# Patient Record
Sex: Male | Born: 1945 | Race: White | Hispanic: No | Marital: Married | Smoking: Former smoker
Health system: Southern US, Community
[De-identification: ages and names within clinical notes are randomized; demographics above are authoritative.]

## PROBLEM LIST (undated history)

## (undated) DIAGNOSIS — L719 Rosacea, unspecified: Secondary | ICD-10-CM

## (undated) DIAGNOSIS — M549 Dorsalgia, unspecified: Secondary | ICD-10-CM

## (undated) DIAGNOSIS — E119 Type 2 diabetes mellitus without complications: Secondary | ICD-10-CM

## (undated) DIAGNOSIS — I2510 Atherosclerotic heart disease of native coronary artery without angina pectoris: Secondary | ICD-10-CM

## (undated) DIAGNOSIS — I10 Essential (primary) hypertension: Secondary | ICD-10-CM

## (undated) DIAGNOSIS — D369 Benign neoplasm, unspecified site: Secondary | ICD-10-CM

## (undated) DIAGNOSIS — E785 Hyperlipidemia, unspecified: Secondary | ICD-10-CM

## (undated) HISTORY — DX: Hyperlipidemia, unspecified: E78.5

## (undated) HISTORY — DX: Rosacea, unspecified: L71.9

## (undated) HISTORY — PX: CORONARY ARTERY BYPASS GRAFT: SHX141

## (undated) HISTORY — DX: Benign neoplasm, unspecified site: D36.9

## (undated) HISTORY — DX: Atherosclerotic heart disease of native coronary artery without angina pectoris: I25.10

## (undated) HISTORY — DX: Dorsalgia, unspecified: M54.9

## (undated) HISTORY — PX: APPENDECTOMY: SHX54

## (undated) HISTORY — PX: AORTIC VALVE REPLACEMENT: SHX41

## (undated) HISTORY — PX: NASAL SEPTUM SURGERY: SHX37

## (undated) HISTORY — DX: Type 2 diabetes mellitus without complications: E11.9

## (undated) HISTORY — PX: CHOLECYSTECTOMY: SHX55

## (undated) HISTORY — DX: Essential (primary) hypertension: I10

## (undated) HISTORY — PX: CATARACT EXTRACTION: SUR2

---

## 1999-02-14 ENCOUNTER — Encounter: Payer: Self-pay | Admitting: Internal Medicine

## 1999-02-14 ENCOUNTER — Inpatient Hospital Stay (HOSPITAL_COMMUNITY): Admission: EM | Admit: 1999-02-14 | Discharge: 1999-02-24 | Payer: Self-pay | Admitting: Emergency Medicine

## 1999-02-15 ENCOUNTER — Encounter: Payer: Self-pay | Admitting: Thoracic Surgery (Cardiothoracic Vascular Surgery)

## 1999-02-17 ENCOUNTER — Encounter: Payer: Self-pay | Admitting: Thoracic Surgery (Cardiothoracic Vascular Surgery)

## 1999-02-18 ENCOUNTER — Encounter: Payer: Self-pay | Admitting: Thoracic Surgery (Cardiothoracic Vascular Surgery)

## 1999-02-19 ENCOUNTER — Encounter: Payer: Self-pay | Admitting: Thoracic Surgery (Cardiothoracic Vascular Surgery)

## 1999-02-21 ENCOUNTER — Encounter: Payer: Self-pay | Admitting: Thoracic Surgery (Cardiothoracic Vascular Surgery)

## 2000-01-08 ENCOUNTER — Encounter
Admission: RE | Admit: 2000-01-08 | Discharge: 2000-01-08 | Payer: Self-pay | Admitting: Thoracic Surgery (Cardiothoracic Vascular Surgery)

## 2000-01-08 ENCOUNTER — Encounter: Payer: Self-pay | Admitting: Thoracic Surgery (Cardiothoracic Vascular Surgery)

## 2001-12-31 ENCOUNTER — Encounter: Payer: Self-pay | Admitting: General Surgery

## 2002-01-02 ENCOUNTER — Ambulatory Visit (HOSPITAL_COMMUNITY): Admission: RE | Admit: 2002-01-02 | Discharge: 2002-01-03 | Payer: Self-pay | Admitting: General Surgery

## 2002-01-02 ENCOUNTER — Encounter (INDEPENDENT_AMBULATORY_CARE_PROVIDER_SITE_OTHER): Payer: Self-pay | Admitting: Specialist

## 2003-10-22 ENCOUNTER — Encounter: Payer: Self-pay | Admitting: Gastroenterology

## 2004-09-18 ENCOUNTER — Ambulatory Visit: Payer: Self-pay | Admitting: Cardiology

## 2005-03-21 ENCOUNTER — Ambulatory Visit: Payer: Self-pay | Admitting: Internal Medicine

## 2005-11-06 ENCOUNTER — Ambulatory Visit: Payer: Self-pay | Admitting: Cardiology

## 2006-02-13 ENCOUNTER — Ambulatory Visit: Payer: Self-pay | Admitting: Internal Medicine

## 2006-03-22 ENCOUNTER — Ambulatory Visit: Payer: Self-pay | Admitting: Internal Medicine

## 2006-05-24 ENCOUNTER — Ambulatory Visit: Payer: Self-pay | Admitting: Internal Medicine

## 2006-06-27 ENCOUNTER — Ambulatory Visit: Payer: Self-pay | Admitting: Cardiology

## 2006-08-21 ENCOUNTER — Ambulatory Visit: Payer: Self-pay | Admitting: Cardiology

## 2006-10-03 ENCOUNTER — Ambulatory Visit: Payer: Self-pay | Admitting: Cardiology

## 2006-10-24 ENCOUNTER — Ambulatory Visit: Payer: Self-pay | Admitting: Cardiology

## 2006-11-13 ENCOUNTER — Ambulatory Visit: Payer: Self-pay | Admitting: Cardiology

## 2006-11-13 LAB — CONVERTED CEMR LAB
ALT: 23 units/L (ref 0–40)
AST: 23 units/L (ref 0–37)
Albumin: 4.2 g/dL (ref 3.5–5.2)
Alkaline Phosphatase: 58 units/L (ref 39–117)
BUN: 8 mg/dL (ref 6–23)
Bilirubin, Direct: 0.2 mg/dL (ref 0.0–0.3)
CO2: 33 meq/L — ABNORMAL HIGH (ref 19–32)
Calcium: 9.5 mg/dL (ref 8.4–10.5)
Chloride: 101 meq/L (ref 96–112)
Cholesterol: 169 mg/dL (ref 0–200)
Creatinine, Ser: 1 mg/dL (ref 0.4–1.5)
Direct LDL: 55.6 mg/dL
GFR calc Af Amer: 97 mL/min
GFR calc non Af Amer: 80 mL/min
Glucose, Bld: 225 mg/dL — ABNORMAL HIGH (ref 70–99)
HDL: 37.1 mg/dL — ABNORMAL LOW (ref >39.0)
Potassium: 3.5 meq/L (ref 3.5–5.1)
Sodium: 139 meq/L (ref 135–145)
Total Bilirubin: 1.2 mg/dL (ref 0.3–1.2)
Total CHOL/HDL Ratio: 4.6
Total Protein: 6.9 g/dL (ref 6.0–8.3)
Triglycerides: 511 mg/dL (ref 0–149)
VLDL: 102 mg/dL — ABNORMAL HIGH (ref 0–40)

## 2006-12-25 ENCOUNTER — Ambulatory Visit: Payer: Self-pay | Admitting: Cardiology

## 2007-01-23 ENCOUNTER — Ambulatory Visit: Payer: Self-pay | Admitting: Cardiology

## 2007-02-12 ENCOUNTER — Ambulatory Visit: Payer: Self-pay

## 2007-02-12 ENCOUNTER — Ambulatory Visit: Payer: Self-pay | Admitting: Internal Medicine

## 2007-02-12 ENCOUNTER — Encounter: Payer: Self-pay | Admitting: Cardiology

## 2007-04-10 ENCOUNTER — Ambulatory Visit: Payer: Self-pay | Admitting: Internal Medicine

## 2007-05-22 ENCOUNTER — Ambulatory Visit: Payer: Self-pay | Admitting: Internal Medicine

## 2007-06-05 ENCOUNTER — Ambulatory Visit: Payer: Self-pay | Admitting: Internal Medicine

## 2007-06-05 LAB — CONVERTED CEMR LAB
ALT: 19 units/L (ref 0–53)
AST: 21 units/L (ref 0–37)
BUN: 11 mg/dL (ref 6–23)
CO2: 32 meq/L (ref 19–32)
Calcium: 9.3 mg/dL (ref 8.4–10.5)
Chloride: 99 meq/L (ref 96–112)
Cholesterol: 161 mg/dL (ref 0–200)
Creatinine, Ser: 1 mg/dL (ref 0.4–1.5)
Direct LDL: 54.1 mg/dL
GFR calc Af Amer: 97 mL/min
GFR calc non Af Amer: 80 mL/min
Glucose, Bld: 247 mg/dL — ABNORMAL HIGH (ref 70–99)
HDL: 31.8 mg/dL — ABNORMAL LOW (ref >39.0)
Hgb A1c MFr Bld: 7.9 % — ABNORMAL HIGH (ref 4.6–6.0)
INR: 2.4 — ABNORMAL HIGH (ref 0.8–1.0)
PSA: 0.61 ng/mL (ref 0.10–4.00)
Potassium: 3.8 meq/L (ref 3.5–5.1)
Prothrombin Time: 19.4 s — ABNORMAL HIGH (ref 10.9–13.3)
Sodium: 140 meq/L (ref 135–145)
Total CHOL/HDL Ratio: 5.1
Triglycerides: 546 mg/dL (ref 0–149)
VLDL: 109 mg/dL — ABNORMAL HIGH (ref 0–40)

## 2007-07-08 ENCOUNTER — Ambulatory Visit: Payer: Self-pay | Admitting: Internal Medicine

## 2007-07-08 ENCOUNTER — Encounter: Payer: Self-pay | Admitting: Internal Medicine

## 2007-07-08 DIAGNOSIS — I10 Essential (primary) hypertension: Secondary | ICD-10-CM

## 2007-07-08 DIAGNOSIS — H4089 Other specified glaucoma: Secondary | ICD-10-CM | POA: Insufficient documentation

## 2007-07-08 DIAGNOSIS — E1351 Other specified diabetes mellitus with diabetic peripheral angiopathy without gangrene: Secondary | ICD-10-CM

## 2007-07-08 DIAGNOSIS — L719 Rosacea, unspecified: Secondary | ICD-10-CM

## 2007-07-08 DIAGNOSIS — E1365 Other specified diabetes mellitus with hyperglycemia: Secondary | ICD-10-CM

## 2007-07-08 DIAGNOSIS — E785 Hyperlipidemia, unspecified: Secondary | ICD-10-CM

## 2007-09-25 ENCOUNTER — Ambulatory Visit: Payer: Self-pay | Admitting: Internal Medicine

## 2007-09-25 LAB — CONVERTED CEMR LAB: Hgb A1c MFr Bld: 7.1 % — ABNORMAL HIGH (ref 4.6–6.0)

## 2007-09-26 ENCOUNTER — Encounter: Payer: Self-pay | Admitting: Internal Medicine

## 2007-10-08 ENCOUNTER — Ambulatory Visit: Payer: Self-pay | Admitting: Cardiology

## 2007-10-08 ENCOUNTER — Encounter: Payer: Self-pay | Admitting: Internal Medicine

## 2007-12-11 ENCOUNTER — Ambulatory Visit: Payer: Self-pay | Admitting: Internal Medicine

## 2007-12-11 ENCOUNTER — Ambulatory Visit: Payer: Self-pay | Admitting: Cardiology

## 2007-12-13 ENCOUNTER — Encounter: Payer: Self-pay | Admitting: Internal Medicine

## 2008-03-02 ENCOUNTER — Telehealth: Payer: Self-pay | Admitting: Internal Medicine

## 2008-03-02 ENCOUNTER — Ambulatory Visit: Payer: Self-pay | Admitting: Internal Medicine

## 2008-03-02 LAB — CONVERTED CEMR LAB
CO2: 31 meq/L (ref 19–32)
Calcium: 9.5 mg/dL (ref 8.4–10.5)
Creatinine, Ser: 1 mg/dL (ref 0.4–1.5)
Glucose, Bld: 246 mg/dL — ABNORMAL HIGH (ref 70–99)
HDL: 27.1 mg/dL — ABNORMAL LOW (ref >39.0)
Hgb A1c MFr Bld: 8.6 % — ABNORMAL HIGH (ref 4.6–6.0)
INR: 5.8 (ref 0.8–1.0)
Total CHOL/HDL Ratio: 5.3
VLDL: 81 mg/dL — ABNORMAL HIGH (ref 0–40)

## 2008-03-04 ENCOUNTER — Telehealth: Payer: Self-pay | Admitting: Internal Medicine

## 2008-03-08 ENCOUNTER — Encounter: Payer: Self-pay | Admitting: Internal Medicine

## 2008-03-10 ENCOUNTER — Ambulatory Visit: Payer: Self-pay | Admitting: Cardiology

## 2008-03-30 ENCOUNTER — Ambulatory Visit: Payer: Self-pay | Admitting: Internal Medicine

## 2008-03-30 LAB — CONVERTED CEMR LAB: INR: 3.2 — ABNORMAL HIGH (ref 0.8–1.0)

## 2008-04-06 ENCOUNTER — Telehealth: Payer: Self-pay | Admitting: Internal Medicine

## 2008-06-14 ENCOUNTER — Encounter: Payer: Self-pay | Admitting: Internal Medicine

## 2008-07-28 ENCOUNTER — Telehealth: Payer: Self-pay | Admitting: Internal Medicine

## 2008-08-06 ENCOUNTER — Ambulatory Visit: Payer: Self-pay | Admitting: Internal Medicine

## 2008-08-06 LAB — CONVERTED CEMR LAB
Alkaline Phosphatase: 41 units/L (ref 39–117)
Bilirubin, Direct: 0.2 mg/dL (ref 0.0–0.3)
Calcium: 9.6 mg/dL (ref 8.4–10.5)
Cholesterol: 122 mg/dL (ref 0–200)
GFR calc Af Amer: 71 mL/min
GFR calc non Af Amer: 59 mL/min
Glucose, Bld: 135 mg/dL — ABNORMAL HIGH (ref 70–99)
HDL: 29 mg/dL — ABNORMAL LOW (ref >39.0)
INR: 4.7 — ABNORMAL HIGH (ref 0.8–1.0)
PSA: 0.78 ng/mL (ref 0.10–4.00)
Potassium: 4.1 meq/L (ref 3.5–5.1)
Prothrombin Time: 47.1 s — ABNORMAL HIGH (ref 10.9–13.3)
Sodium: 144 meq/L (ref 135–145)
TSH: 1.62 microintl units/mL (ref 0.35–5.50)
Total Bilirubin: 1 mg/dL (ref 0.3–1.2)
Total Protein: 6.9 g/dL (ref 6.0–8.3)
VLDL: 35 mg/dL (ref 0–40)

## 2008-08-17 ENCOUNTER — Ambulatory Visit: Payer: Self-pay | Admitting: Internal Medicine

## 2008-09-15 ENCOUNTER — Telehealth: Payer: Self-pay | Admitting: Internal Medicine

## 2008-09-23 ENCOUNTER — Telehealth: Payer: Self-pay | Admitting: Internal Medicine

## 2008-10-07 ENCOUNTER — Telehealth: Payer: Self-pay | Admitting: Internal Medicine

## 2008-11-22 ENCOUNTER — Telehealth: Payer: Self-pay | Admitting: Internal Medicine

## 2008-12-23 ENCOUNTER — Telehealth: Payer: Self-pay | Admitting: Internal Medicine

## 2009-02-17 ENCOUNTER — Telehealth: Payer: Self-pay | Admitting: Internal Medicine

## 2009-02-28 ENCOUNTER — Telehealth: Payer: Self-pay | Admitting: Internal Medicine

## 2009-03-02 ENCOUNTER — Ambulatory Visit: Payer: Self-pay | Admitting: Internal Medicine

## 2009-03-02 DIAGNOSIS — M549 Dorsalgia, unspecified: Secondary | ICD-10-CM | POA: Insufficient documentation

## 2009-03-15 ENCOUNTER — Ambulatory Visit: Payer: Self-pay | Admitting: Cardiology

## 2009-04-04 ENCOUNTER — Telehealth: Payer: Self-pay | Admitting: Internal Medicine

## 2009-04-15 ENCOUNTER — Telehealth: Payer: Self-pay | Admitting: Internal Medicine

## 2009-05-19 ENCOUNTER — Ambulatory Visit: Payer: Self-pay | Admitting: Internal Medicine

## 2009-05-19 ENCOUNTER — Telehealth: Payer: Self-pay | Admitting: Internal Medicine

## 2009-05-19 LAB — CONVERTED CEMR LAB
ALT: 22 units/L (ref 0–53)
AST: 21 units/L (ref 0–37)
Albumin: 4.4 g/dL (ref 3.5–5.2)
Bilirubin Urine: NEGATIVE
CO2: 30 meq/L (ref 19–32)
Chloride: 103 meq/L (ref 96–112)
Cholesterol: 147 mg/dL (ref 0–200)
Creatinine, Ser: 1.2 mg/dL (ref 0.4–1.5)
Direct LDL: 76.1 mg/dL
HCT: 37.8 % — ABNORMAL LOW (ref 39.0–52.0)
HDL: 31.2 mg/dL — ABNORMAL LOW (ref >39.00)
Hemoglobin: 12.7 g/dL — ABNORMAL LOW (ref 13.0–17.0)
Ketones, ur: NEGATIVE mg/dL
Lymphocytes Relative: 22 % (ref 12–46)
Lymphs Abs: 0.9 10*3/uL (ref 0.7–4.0)
Monocytes Absolute: 0.3 10*3/uL (ref 0.1–1.0)
Monocytes Relative: 7 % (ref 3–12)
Neutro Abs: 2.6 10*3/uL (ref 1.7–7.7)
PSA: 0.55 ng/mL (ref 0.10–4.00)
Potassium: 4.2 meq/L (ref 3.5–5.1)
Prothrombin Time: 80.6 s (ref 9.1–11.7)
RBC: 4.24 M/uL (ref 4.22–5.81)
Sodium: 144 meq/L (ref 135–145)
TSH: 1.61 microintl units/mL (ref 0.35–5.50)
Total Bilirubin: 1 mg/dL (ref 0.3–1.2)
Total CHOL/HDL Ratio: 5
Total Protein, Urine: 30 mg/dL
Triglycerides: 268 mg/dL — ABNORMAL HIGH (ref 0.0–149.0)
pH: 6 (ref 5.0–8.0)

## 2009-05-26 ENCOUNTER — Encounter: Payer: Self-pay | Admitting: Cardiology

## 2009-06-01 ENCOUNTER — Ambulatory Visit: Payer: Self-pay | Admitting: Internal Medicine

## 2009-06-01 LAB — CONVERTED CEMR LAB
Hgb A1c MFr Bld: 6.7 % — ABNORMAL HIGH (ref 4.6–6.5)
INR: 3.2 — ABNORMAL HIGH (ref 0.8–1.0)
Prothrombin Time: 32.7 s — ABNORMAL HIGH (ref 9.1–11.7)

## 2009-06-02 ENCOUNTER — Encounter (INDEPENDENT_AMBULATORY_CARE_PROVIDER_SITE_OTHER): Payer: Self-pay | Admitting: *Deleted

## 2009-06-05 DIAGNOSIS — I2510 Atherosclerotic heart disease of native coronary artery without angina pectoris: Secondary | ICD-10-CM

## 2009-06-05 DIAGNOSIS — Z952 Presence of prosthetic heart valve: Secondary | ICD-10-CM | POA: Insufficient documentation

## 2009-06-15 ENCOUNTER — Encounter: Payer: Self-pay | Admitting: Internal Medicine

## 2009-06-15 ENCOUNTER — Encounter (INDEPENDENT_AMBULATORY_CARE_PROVIDER_SITE_OTHER): Payer: Self-pay | Admitting: *Deleted

## 2009-06-15 ENCOUNTER — Ambulatory Visit: Payer: Self-pay | Admitting: Gastroenterology

## 2009-06-15 DIAGNOSIS — Z86010 Personal history of colonic polyps: Secondary | ICD-10-CM

## 2009-06-16 ENCOUNTER — Telehealth (INDEPENDENT_AMBULATORY_CARE_PROVIDER_SITE_OTHER): Payer: Self-pay | Admitting: *Deleted

## 2009-06-21 LAB — HM COLONOSCOPY

## 2009-06-28 ENCOUNTER — Telehealth: Payer: Self-pay | Admitting: Gastroenterology

## 2009-06-29 ENCOUNTER — Telehealth: Payer: Self-pay | Admitting: Internal Medicine

## 2009-06-29 ENCOUNTER — Ambulatory Visit: Payer: Self-pay | Admitting: Gastroenterology

## 2009-06-29 ENCOUNTER — Encounter: Payer: Self-pay | Admitting: Gastroenterology

## 2009-07-01 ENCOUNTER — Encounter: Payer: Self-pay | Admitting: Gastroenterology

## 2009-07-04 ENCOUNTER — Encounter (INDEPENDENT_AMBULATORY_CARE_PROVIDER_SITE_OTHER): Payer: Self-pay

## 2009-08-16 ENCOUNTER — Telehealth: Payer: Self-pay | Admitting: Internal Medicine

## 2009-08-23 ENCOUNTER — Telehealth: Payer: Self-pay | Admitting: Internal Medicine

## 2009-08-30 ENCOUNTER — Telehealth: Payer: Self-pay | Admitting: Internal Medicine

## 2009-09-06 ENCOUNTER — Telehealth: Payer: Self-pay | Admitting: Internal Medicine

## 2009-09-07 ENCOUNTER — Ambulatory Visit: Payer: Self-pay | Admitting: Internal Medicine

## 2009-09-07 DIAGNOSIS — L989 Disorder of the skin and subcutaneous tissue, unspecified: Secondary | ICD-10-CM | POA: Insufficient documentation

## 2009-09-07 DIAGNOSIS — N453 Epididymo-orchitis: Secondary | ICD-10-CM | POA: Insufficient documentation

## 2009-09-07 DIAGNOSIS — J019 Acute sinusitis, unspecified: Secondary | ICD-10-CM

## 2009-09-07 LAB — CONVERTED CEMR LAB
Leukocytes, UA: NEGATIVE
Nitrite: NEGATIVE
Urine Glucose: NEGATIVE mg/dL
Urobilinogen, UA: 0.2 (ref 0.0–1.0)

## 2009-09-16 ENCOUNTER — Telehealth: Payer: Self-pay | Admitting: Internal Medicine

## 2009-09-19 ENCOUNTER — Telehealth: Payer: Self-pay | Admitting: Internal Medicine

## 2009-11-28 ENCOUNTER — Telehealth: Payer: Self-pay | Admitting: Internal Medicine

## 2009-12-08 ENCOUNTER — Ambulatory Visit: Payer: Self-pay | Admitting: Cardiology

## 2010-02-02 ENCOUNTER — Telehealth (INDEPENDENT_AMBULATORY_CARE_PROVIDER_SITE_OTHER): Payer: Self-pay | Admitting: *Deleted

## 2010-06-01 ENCOUNTER — Telehealth: Payer: Self-pay | Admitting: Internal Medicine

## 2010-06-02 ENCOUNTER — Ambulatory Visit: Payer: Self-pay | Admitting: Internal Medicine

## 2010-06-02 DIAGNOSIS — R6882 Decreased libido: Secondary | ICD-10-CM

## 2010-06-02 LAB — CONVERTED CEMR LAB
ALT: 19 units/L (ref 0–53)
AST: 22 units/L (ref 0–37)
CO2: 30 meq/L (ref 19–32)
Calcium: 9.4 mg/dL (ref 8.4–10.5)
Creatinine, Ser: 1.1 mg/dL (ref 0.4–1.5)
INR: 2.6 — ABNORMAL HIGH (ref 0.8–1.0)
TSH: 1.32 microintl units/mL (ref 0.35–5.50)
Testosterone: 254.39 ng/dL — ABNORMAL LOW (ref 350.00–890.00)
Total Bilirubin: 1 mg/dL (ref 0.3–1.2)
Triglycerides: 223 mg/dL — ABNORMAL HIGH (ref 0.0–149.0)

## 2010-08-31 ENCOUNTER — Telehealth: Payer: Self-pay | Admitting: Internal Medicine

## 2010-11-12 LAB — CONVERTED CEMR LAB
ALT: 23 units/L (ref 0–53)
Albumin: 4.2 g/dL (ref 3.5–5.2)
Alkaline Phosphatase: 72 units/L (ref 39–117)
Direct LDL: 53.3 mg/dL
Total Bilirubin: 1.9 mg/dL — ABNORMAL HIGH (ref 0.3–1.2)
Triglycerides: 538 mg/dL (ref 0–149)

## 2010-11-14 NOTE — Progress Notes (Signed)
Summary: Glipizide refill  Phone Note Refill Request Message from:  Fax from Pharmacy on November 28, 2009 4:08 PM  Refills Requested: Medication #1:  GLIPIZIDE 10 MG TABS Take 1/2 tablet by mouth two times a day Patient has not seen you since 05/2009. How many refills would you like to provide?  Next Appointment Scheduled: none Initial call taken by: Lucious Groves,  November 28, 2009 4:08 PM  Follow-up for Phone Call        ok for as needed refills  Follow-up by: Jacques Navy MD,  November 28, 2009 6:10 PM    Prescriptions: GLIPIZIDE 10 MG TABS (GLIPIZIDE) Take 1/2 tablet by mouth two times a day  #90 x 3   Entered by:   Lamar Sprinkles, CMA   Authorized by:   Jacques Navy MD   Signed by:   Lamar Sprinkles, CMA on 11/29/2009   Method used:   Electronically to        [facility] [phone]* (retail)       [address] [place], [place]  [address]       Ph: [phone]       Fax: [phone]   RxID:   [id number] GLIPIZIDE 10 MG TABS (GLIPIZIDE) Take 1/2 tablet by mouth two times a day  #90 x 0   Entered by:   Lucious Groves   Authorized by:   Jacques Navy MD   Signed by:   Jacques Navy MD on 11/28/2009   Method used:   Electronically to        [facility] [phone]* (retail)       [address] [place], [place]  [address]       Ph: [phone]       Fax: [phone]   RxID:   [id number]

## 2010-11-14 NOTE — Progress Notes (Signed)
Summary: RF  Phone Note From Pharmacy   Caller: [facility] Summary of Call: Pharm called req refill of warfarin 5mg  take as directed. Ok to fill? Who manages pt's INR? Should they fill med?  Initial call taken by: Lamar Sprinkles, CMA,  June 01, 2010 3:12 PM  Follow-up for Phone Call        I forget who manages his INR - I vaguely remember him having a home unit. OK to refill Rx Follow-up by: Jacques Navy MD,  June 01, 2010 4:22 PM    Prescriptions: WARFARIN SODIUM 7.5 MG TABS (WARFARIN SODIUM) Take 1 tablet by mouth once a day  #90 x 1   Entered by:   Lamar Sprinkles, CMA   Authorized by:   Jacques Navy MD   Signed by:   Lamar Sprinkles, CMA on 06/01/2010   Method used:   Electronically to        [facility] [phone]* (retail)       [address] [place], [place]  [address]       Ph: [phone]       Fax: [phone]   RxID:   [id number]

## 2010-11-14 NOTE — Progress Notes (Signed)
Summary: REFILL COUMADIN  Phone Note Refill Request Message from:  Pharmacy  Refills Requested: Medication #1:  WARFARIN SODIUM 7.5 MG TABS Take 1 tablet by mouth once a day Pharm req refill, who is managing INR's? Is this ok to fill?   Initial call taken by: Lamar Sprinkles, CMA,  August 31, 2010 11:45 AM  Follow-up for Phone Call        1. Ptient has a home monitor - he manages his own dosing. 2. OK for as needed refills. Follow-up by: Jacques Navy MD,  August 31, 2010 1:01 PM    Prescriptions: WARFARIN SODIUM 7.5 MG TABS (WARFARIN SODIUM) Take 1 tablet by mouth once a day  #90 x 3   Entered by:   Lamar Sprinkles, CMA   Authorized by:   Jacques Navy MD   Signed by:   Lamar Sprinkles, CMA on 08/31/2010   Method used:   Electronically to        [facility] [phone]* (retail)       [address] [place], [place]  [address]       Ph: [phone]       Fax: [phone]   RxID:   [id number]

## 2010-11-14 NOTE — Progress Notes (Signed)
Summary: Records request from [facility]  Request for records received from [facility]. Request forwarded to [facility]. Dena Chavis  February 02, 2010 3:27 PM

## 2010-11-14 NOTE — Assessment & Plan Note (Signed)
Summary: YEARLY FU/ TO COME FASTING/NWS   Vital Signs:  Patient profile:   65 year old male Height:      71 inches Weight:      184 pounds BMI:     25.76 O2 Sat:      98 % on Room air Temp:     97.3 degrees F oral Pulse rate:   55 / minute BP sitting:   128 / 70  (left arm) Cuff size:   regular  Vitals Entered By: Bill Salinas CMA (June 02, 2010 10:01 AM)  O2 Flow:  Room air CC: cpx/ ab   Primary Care Provider:  Laurita Quint, MD  CC:  cpx/ ab.  History of Present Illness: Patient presents for routine medical follow-up. He has put on a little weight which may be increased muscle secondary to more rigorous work-out program.   He reports that he is feeling OK. He does have mild knee pain lateral tibial condyle suggestive of tendonitis. He is working out every other day. He does report that he seems to get fluid in/on the right ear frequently. He reports he is having decreased tumescence but has plenty of interest.   He does do home monitoring of his INR. He is interested in having this done in the lab today.   Preventive Screening-Counseling & Management  Alcohol-Tobacco     Alcohol drinks/day: 0     Smoking Status: quit     Year Quit: 40 years ago  Caffeine-Diet-Exercise     Caffeine use/day: 4 drinks per day     Does Patient Exercise: yes     Type of exercise: weight lifting, cardio     Exercise (avg: min/session): 30-60     Times/week: 4  Hep-HIV-STD-Contraception     Dental Visit-last 6 months no     Sun Exposure-Excessive: yes  Safety-Violence-Falls     Seat Belt Use: yes     Firearms in the Home: firearms in the home     Smoke Detectors: yes     Fall Risk: Low fall risk  Current Medications (verified): 1)  Furosemide 40 Mg  Tabs (Furosemide) .... Once Daily 2)  Crestor 10 Mg  Tabs (Rosuvastatin Calcium) .... Take One Tablet Every Morning 3)  Benazepril Hcl 20 Mg Tabs (Benazepril Hcl) .Marland Kitchen.. 1 By Mouth Once Daily For Blood Pressure 4)  Warfarin Sodium  7.5 Mg Tabs (Warfarin Sodium) .... Take 1 Tablet By Mouth Once A Day 5)  Klor-Con 10 10 Meq Cr-Tabs (Potassium Chloride) .Marland Kitchen.. 1 Two Times A Day 6)  Lopressor 50 Mg  Tabs (Metoprolol Tartrate) .... Take 1 Tablet By Mouth Two Times A Day 7)  Metformin Hcl 1000 Mg  Tabs (Metformin Hcl) .Marland Kitchen.. 1 By Mouth Two Times A Day. 8)  Multivitamins   Tabs (Multiple Vitamin) .... Take 1 Tablet By Mouth Once A Day 9)  Xalatan 0.005 %  Soln (Latanoprost) .... As Directed 10)  Norvasc 10 Mg  Tabs (Amlodipine Besylate) .... Take 1 Tablet By Mouth Once A Day 11)  Zolpidem Tartrate 10 Mg  Tabs (Zolpidem Tartrate) .Marland Kitchen.. 1 By Mouth At Bedtime As Needed 12)  Fenofibrate 160 Mg Tabs (Fenofibrate) .... One By Mouth Daily 13)  Glipizide 10 Mg Tabs (Glipizide) .... Take 1/2 Tablet By Mouth Two Times A Day 14)  Vitamin C Cr 500 Mg Cr-Tabs (Ascorbic Acid) .... Take One Tablt Three Times A Day 15)  Promethazine-Codeine 6.25-10 Mg/26ml Syrp (Promethazine-Codeine)  Allergies (verified): 1)  ! 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 valve 2000  Family History: Last updated: 06-30-2009 Father - deceased @ 50's  : CAD/MI,  Mother- deceased @ 29: COPD Neg- colon cancer Maternal Uncle - prostate cancer Maternal Uncle x 2- DM Family History of Heart Disease: Father  Social History: Last updated: 06-30-2009 [employer]  Married '68 - 12 years divorced, long-term relationship 30 yrs 3 daughters - '70, '72, '75: 5  grandchildren work: independent man Patient is a former smoker. stopped smoking over 25 years ago Alcohol Use - no Daily Caffeine Use-4 cups daily Illicit Drug Use - no Patient gets regular exercise.  Risk Factors: Alcohol Use: 0 (06/02/2010) Caffeine Use: 4 drinks per day (06/02/2010) Exercise: yes (06/02/2010)  Risk Factors: Smoking Status: quit (06/02/2010)  Social History: Caffeine use/day:  4 drinks per day Dental Care w/in 6 mos.:  no Sun Exposure-Excessive:  yes Seat Belt Use:  yes Fall Risk:  Low fall risk  Review of Systems  The patient denies anorexia, fever, weight loss, weight gain, decreased hearing, hoarseness, chest pain, dyspnea on exertion, peripheral edema, prolonged cough, hemoptysis, abdominal pain, hematochezia, hematuria, genital sores, muscle weakness, transient blindness, difficulty walking, depression, abnormal bleeding, and angioedema.    Physical Exam  General:  WNWD slender white male in no distress Head:  normocephalic, atraumatic, and no abnormalities observed.   Eyes:  vision grossly intact, pupils equal, pupils round, corneas and lenses clear, and no injection.   Ears:  R ear normal, L ear normal, and no external deformities.   Nose:  no external deformity and no external erythema.   Mouth:  Oral mucosa and oropharynx without lesions or exudates.  Teeth in good repair. Neck:  supple, full ROM, no thyromegaly, no thyroid nodules or tenderness, and no carotid bruits.   Chest Wall:  well healed sternotomy scar. No deformities Lungs:  Normal respiratory effort, chest expands symmetrically. Lungs are clear to auscultation, no crackles or wheezes. Heart:  II/VI mechanical valve murmur, regular rate, no gallop Abdomen:  soft, non-tender, normal bowel sounds, no masses, no guarding, no rigidity, and no hepatomegaly.   Rectal:  No external abnormalities noted. Normal sphincter tone. No rectal masses or tenderness. Prostate:  Prostate gland firm and  smooth, no enlargement, nodularity, tenderness, mass, asymmetry or induration. Msk:  normal ROM, no joint tenderness, no joint swelling, no joint warmth, and no redness over joints.   Pulses:  2+ radial and PT pulses Extremities:  No clubbing, cyanosis, edema, or deformity noted with normal full range of motion of all joints.   Neurologic:  alert & oriented X3, cranial nerves II-XII intact, strength normal in all extremities, gait normal, and DTRs symmetrical and normal.   Skin:  turgor normal, color normal, and no suspicious lesions.   Cervical Nodes:  no anterior cervical adenopathy and no posterior cervical adenopathy.   Axillary Nodes:  no R axillary adenopathy and no L axillary adenopathy.   Psych:  Oriented X3, memory intact for recent and remote, good eye contact, and not anxious appearing.    Diabetes Management Exam:    Foot Exam (with socks and/or shoes not present):  Sensory-Pinprick/Light touch:          Left medial foot (L-4): normal          Left dorsal foot (L-5): normal          Left lateral foot (S-1): normal          Right medial foot (L-4): normal          Right dorsal foot (L-5): normal          Right lateral foot (S-1): normal       Sensory-Monofilament:          Left foot: normal          Right foot: normal       Inspection:          Left foot: normal          Right foot: normal       Nails:          Left foot: normal          Right foot: normal   Impression & Recommendations:  Problem # 1:  DECREASED SEXUAL DESIRE (ICD-799.81) New problem. No functional problems - able to attain and sustain an erection.  Plan - testosterone level with recommendations to follow.  Orders: TLB-Testosterone, Total (84403-TESTO)  Addendum - testosterone 254 (350 + is normal )  Plan - discuss replacement options   Problem # 2:  COUMADIN THERAPY (ICD-V58.61) Patient home uses home monitor and is usually close. Will check INR in lab.  addendum- INR 2.6 =  therapeutic  Orders: TLB-PT (Protime) (85610-PTP)  Problem # 3:  HYPERTENSION (ICD-401.9)  His updated medication list for this problem includes:    Furosemide 40 Mg Tabs (Furosemide) ..... Once daily    Benazepril Hcl 20 Mg Tabs (Benazepril hcl) .Marland Kitchen... 1 by mouth once daily for blood pressure    Lopressor 50 Mg Tabs (Metoprolol tartrate) .Marland Kitchen... Take 1 tablet by mouth two times a day    Norvasc 10 Mg Tabs (Amlodipine besylate) .Marland Kitchen... Take 1 tablet by mouth once a day  Orders: TLB-BMP (Basic Metabolic Panel-BMET) (80048-METABOL)  BP today: 128/70 Prior BP: 146/77 (12/08/2009)  Good BP control, will continue present medications.  Problem # 4:  HYPERLIPIDEMIA (ICD-272.4) Will check lab with recommendations to follow.  His updated medication list for this problem includes:    Crestor 10 Mg Tabs (Rosuvastatin calcium) .Marland Kitchen... Take one tablet every morning    Fenofibrate 160 Mg Tabs (Fenofibrate) ..... One by mouth daily  Orders: TLB-Lipid Panel (80061-LIPID) TLB-Hepatic/Liver Function Pnl (80076-HEPATIC) TLB-TSH (Thyroid Stimulating Hormone) (84443-TSH)  Addendum - good control with LDL 75.6 with goal of less than 80  Plan - continue present medications  Problem # 5:  DIABETES MELLITUS, TYPE II (ICD-250.00) Patient has been conscientious about his diet and exercise. Due for A1C   His updated medication list for this problem includes:    Benazepril Hcl 20 Mg Tabs (Benazepril hcl) .Marland Kitchen... 1 by mouth once daily for blood pressure    Metformin Hcl 1000 Mg Tabs (Metformin hcl) .Marland Kitchen... 1 by mouth two times a day.    Glipizide 10 Mg Tabs (Glipizide) .Marland Kitchen... Take 1/2 tablet by mouth two times a day  Orders: TLB-A1C / Hgb A1C (Glycohemoglobin) (83036-A1C)  Addendum - A1C 7.5%, above goal of 7%.  Plan - will discuss options for modification of medications at next visit.   Problem # 6:  GLAUCOMA NEC (ICD-365.89) Current with opthalmologist - will request copy of last exam.  Problem #  7:   Preventive Health Care (ICD-V70.0)  Uneventful history. Physical exam is normal. Lab results within normal limits except for testosterone and A1C. Current with colorectal cancer screening with last exam Sept '10. Immunizations - shingels vaccine in June '07. Will need to check paper chart for pneumonia and tetnus.  Mr. Schoolfield invests in his health with regular exercise and diet. He is 100% independent in all ADLs. He has no fall or injury risk. He is in good spirits with signs or symptoms of depression.  In summary - a nice man who is medically stable. He will return in the near future to discuss testosterone replacement and modification of his diabetes management.   Orders: Welcome to Medicare, Physical [phone])  Complete Medication List: 1)  Furosemide 40 Mg Tabs (Furosemide) .... Once daily 2)  Crestor 10 Mg Tabs (Rosuvastatin calcium) .... Take one tablet every morning 3)  Benazepril Hcl 20 Mg Tabs (Benazepril hcl) .Marland Kitchen.. 1 by mouth once daily for blood pressure 4)  Warfarin Sodium 7.5 Mg Tabs (Warfarin sodium) .... Take 1 tablet by mouth once a day 5)  Klor-con 10 10 Meq Cr-tabs (Potassium chloride) .Marland Kitchen.. 1 two times a day 6)  Lopressor 50 Mg Tabs (Metoprolol tartrate) .... Take 1 tablet by mouth two times a day 7)  Metformin Hcl 1000 Mg Tabs (Metformin hcl) .Marland Kitchen.. 1 by mouth two times a day. 8)  Multivitamins Tabs (Multiple vitamin) .... Take 1 tablet by mouth once a day 9)  Xalatan 0.005 % Soln (Latanoprost) .... As directed 10)  Norvasc 10 Mg Tabs (Amlodipine besylate) .... Take 1 tablet by mouth once a day 11)  Zolpidem Tartrate 10 Mg Tabs (Zolpidem tartrate) .Marland Kitchen.. 1 by mouth at bedtime as needed 12)  Fenofibrate 160 Mg Tabs (Fenofibrate) .... One by mouth daily 13)  Glipizide 10 Mg Tabs (Glipizide) .... Take 1/2 tablet by mouth two times a day 14)  Vitamin C Cr 500 Mg Cr-tabs (Ascorbic acid) .... Take one tablt three times a day 15)  Promethazine-codeine 6.25-10 Mg/2ml Syrp  (Promethazine-codeine)  Other Orders: TLB-PSA (Prostate Specific Antigen) (84153-PSA)   Note: All result statuses are Final unless otherwise noted.  Tests: (1) BMP (METABOL)   Sodium                    141 mEq/L                   135-145   Potassium                 4.0 mEq/L                   3.5-5.1   Chloride                  104 mEq/L                   96-112   Carbon Dioxide            30 mEq/L                    19-32   Glucose              [H]  185 mg/dL                   60-45   BUN  13 mg/dL                    8-65   Creatinine                1.1 mg/dL                   7.8-4.6   Calcium                   9.4 mg/dL                   9.6-29.5   GFR                       74.30 mL/min                >60  Tests: (2) Lipid Panel (LIPID)   Cholesterol               136 mg/dL                   2-841     ATP III Classification            Desirable:  < 200 mg/dL                    Borderline High:  200 - 239 mg/dL               High:  > = 240 mg/dL   Triglycerides        [H]  223.0 mg/dL                 3.2-440.1     Normal:  <150 mg/dL     Borderline High:  027 - 199 mg/dL   HDL                  [L]  25.36 mg/dL                 >64.40   VLDL Cholesterol     [H]  44.6 mg/dL                  3.4-74.2  CHO/HDL Ratio:  CHD Risk                             4                    Men          Women     1/2 Average Risk     3.4          3.3     Average Risk          5.0          4.4     2X Average Risk          9.6          7.1     3X Average Risk          15.0          11.0                           Tests: (3) Hepatic/Liver Function Panel (HEPATIC)   Total Bilirubin           1.0 mg/dL  0.3-1.2   Direct Bilirubin          0.2 mg/dL                   1.6-1.0   Alkaline Phosphatase      46 U/L                      39-117   AST                       22 U/L                      0-37   ALT                       19 U/L                      0-53    Total Protein             6.9 g/dL                    9.6-0.4   Albumin                   4.6 g/dL                    5.4-0.9  Tests: (4) TSH (TSH)   FastTSH                   1.32 uIU/mL                 0.35-5.50  Tests: (5) Hemoglobin A1C (A1C)   Hemoglobin A1C       [H]  7.5 %                       4.6-6.5     Glycemic Control Guidelines for People with Diabetes:     Non Diabetic:  <6%     Goal of Therapy: <7%     Additional Action Suggested:  >8%   Tests: (6) Prostate Specific Antigen (PSA)   PSA-Hyb                   0.81 ng/mL                  0.10-4.00  Tests: (7) PT (PTP)   Prothrombin Time     [H]  27.3 sec                    9.7-11.8   INR                  [H]  2.6 ratio                   0.8-1.0  Tests: (8) Testosterone, Total (TESTO)   Testosterone         [L]  254.39 ng/dL                811.91-478.29 Prescriptions: ZOLPIDEM TARTRATE 10 MG  TABS (ZOLPIDEM TARTRATE) 1 by mouth at bedtime as needed  #30 x 5   Entered and Authorized by:   Jacques Navy MD   Signed by:   Jacques Navy MD on 06/02/2010   Method used:   Handwritten   RxID:   [id number] PROMETHAZINE-CODEINE 6.25-10 MG/5ML SYRP (PROMETHAZINE-CODEINE)   #  8oz x 1   Entered and Authorized by:   Jacques Navy MD   Signed by:   Jacques Navy MD on 06/02/2010   Method used:   Handwritten   RxID:   [id number]   Preventive Care Screening  Last Flu Shot:    Date:  07/27/2009    Results:  given

## 2010-11-14 NOTE — Assessment & Plan Note (Signed)
Summary: rov/ gd  Medications Added CRESTOR 10 MG  TABS (ROSUVASTATIN CALCIUM) take one tablet every morning VITAMIN C CR 500 MG CR-TABS (ASCORBIC ACID) take one tablt three times a day      Allergies Added:   Visit Type:  Follow-up Primary Provider:  Laurita Quint, MD  CC:  CAD/AVR.  History of Present Illness: The patient presents for yearly followup. Since I last saw him he has done well from a cardiovascular standpoint. He has had no new symptoms. He exercises routinely. Uses a StairMaster. With this level of activity he denies any chest discomfort, neck or arm discomfort. He has no palpitations, presyncope or syncope. He has no PND or orthopnea. Unfortunately despite the fact that he has home Coumadin monitoring he has not been checking his routinely. He checks it occasionally and says it is within the 2-3 target that we have advised. The last time it was drawn by his primary physician it was 3.2. He is an intelligent man who understands the implications of not watching this closely.  Current Medications (verified): 1)  Furosemide 40 Mg  Tabs (Furosemide) .... Once Daily 2)  Crestor 10 Mg  Tabs (Rosuvastatin Calcium) .... Take One Tablet Every Morning 3)  Benazepril Hcl 20 Mg Tabs (Benazepril Hcl) .Marland Kitchen.. 1 By Mouth Once Daily For Blood Pressure 4)  Warfarin Sodium 7.5 Mg Tabs (Warfarin Sodium) .... Take 1 Tablet By Mouth Once A Day 5)  Klor-Con 10 10 Meq Cr-Tabs (Potassium Chloride) .Marland Kitchen.. 1 Two Times A Day 6)  Lopressor 50 Mg  Tabs (Metoprolol Tartrate) .... Take 1 Tablet By Mouth Two Times A Day 7)  Metformin Hcl 1000 Mg  Tabs (Metformin Hcl) .Marland Kitchen.. 1 By Mouth Two Times A Day. 8)  Multivitamins   Tabs (Multiple Vitamin) .... Take 1 Tablet By Mouth Once A Day 9)  Xalatan 0.005 %  Soln (Latanoprost) .... As Directed 10)  Norvasc 10 Mg  Tabs (Amlodipine Besylate) .... Take 1 Tablet By Mouth Once A Day 11)  Zolpidem Tartrate 10 Mg  Tabs (Zolpidem Tartrate) .Marland Kitchen.. 1 By Mouth At Bedtime As  Needed 12)  Fenofibrate 160 Mg Tabs (Fenofibrate) .... One By Mouth Daily 13)  Glipizide 10 Mg Tabs (Glipizide) .... Take 1/2 Tablet By Mouth Two Times A Day 14)  Vitamin C Cr 500 Mg Cr-Tabs (Ascorbic Acid) .... Take One Tablt Three Times A Day  Allergies (verified): 1)  ! * Niaspan  Past History:  Past Medical History: Reviewed history from 06/15/2009 and no changes required. 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 Jude's valve 2000  Review of Systems       As stated in the HPI and negative for all other systems.   Vital Signs:  Patient profile:   65 year old male Height:      71 inches Weight:      179 pounds BMI:     25.06 Pulse rate:   70 / minute Pulse rhythm:   regular BP sitting:   146 / 77  (left arm) Cuff size:   regular  Vitals Entered By: Marrion Coy, CNA (December 08, 2009 4:38 PM)  Physical Exam  General:  Well developed, well nourished, in no acute distress. Head:  normocephalic and atraumatic Eyes:  PERRLA/EOM intact; conjunctiva and lids normal. Mouth:  Teeth, gums and palate normal. Oral mucosa normal. Neck:  Neck supple, no JVD. No masses, thyromegaly or abnormal cervical nodes. Chest Wall:  well healed sternotomy scar Lungs:  normal respiratory effort and normal breath sounds.   Heart:  S1 within normal limits, mechanical S2, 2/6 apical systolic murmur radiating slightly of the aortic outflow tract, no diastolic murmurs Abdomen:  Bowel sounds positive; abdomen soft and non-tender without masses, organomegaly, or hernias noted. No hepatosplenomegaly. Msk:  Back  normal, normal gait. Muscle strength and tone normal. Extremities:  No clubbing or cyanosis. Neurologic:  Alert and oriented x 3. Skin:  Intact without lesions or rashes. Cervical Nodes:  no significant adenopathy Axillary Nodes:  no significant adenopathy Inguinal Nodes:  no significant adenopathy Psych:  Normal affect.    EKG  Procedure date:  12/08/2009  Findings:      normal sinus rhythm, rate 67, right bundle branch block, first degree AV block, no acute ST-T wave changes.  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 He understands endocarditis prophylaxis. In particular I again discussed the danger of not following his Coumadin closely. He has refused to followup her Coumadin visits. He has a home monitor. Dabibitran is not improved in this situation.  At this point he agrees more than he has in the past  Problem # 2:  HYPERLIPIDEMIA (ICD-272.4) I reviewed his lipid profile from August of last year. Triglycerides were high at 268 with an HDL of 31.2. However, his LDL is well treated at 76. He will have this repeated by Dr.Norins in the summer. He knows the target triglycerides 150 and HDL greater than 40. For now he will remain on the meds as listed.  Problem # 3:  HYPERTENSION (ICD-401.9) His blood pressure is well controlled and he will continue on the meds as listed.  Problem # 4:  CORONARY ARTERY DISEASE (ICD-414.00) He had a negative stress perfusion study in 2008. No further testing is indicated at this point. Orders: EKG w/ Interpretation (93000)  Patient Instructions: 1)  Your physician recommends that you schedule a follow-up appointment in: 12 months with Dr Antoine Poche.  You will recieve a card in the mail to remind you about 2 months prior. 2)  Your physician recommends that you continue on your current medications as directed. Please refer to the  Current Medication list given to you today.

## 2010-12-06 ENCOUNTER — Encounter: Payer: Self-pay | Admitting: Cardiology

## 2010-12-06 ENCOUNTER — Ambulatory Visit (INDEPENDENT_AMBULATORY_CARE_PROVIDER_SITE_OTHER): Payer: Medicare Other | Admitting: Cardiology

## 2010-12-06 DIAGNOSIS — I2510 Atherosclerotic heart disease of native coronary artery without angina pectoris: Secondary | ICD-10-CM

## 2010-12-06 DIAGNOSIS — I359 Nonrheumatic aortic valve disorder, unspecified: Secondary | ICD-10-CM

## 2010-12-12 NOTE — Assessment & Plan Note (Signed)
Summary: F1Y/PER PT CALL=MJ  Medications Added VITAMIN C CR 500 MG CR-TABS (ASCORBIC ACID) take one tablt  two times a day      Allergies Added:   Visit Type:  Follow-up Primary Provider:  Laurita Quint, MD  CC:  AVR/CAD.  History of Present Illness: The patient returns for yearly followup.Since I last saw him he has had no new cardiovascular complaints. He has been continuing to exercise every other day. He follows his own Coumadin at home. He reports that he is having no chest pressure, neck or arm discomfort. He is having no palpitations, presyncope or syncope.He is having no problems taking his blood. I did review recent labs and he had triglycerides of 223. His HDL was down to 32.3. His hemoglobin A1c was 7.5. His LDL was well within target.  Current Medications (verified): 1)  Furosemide 40 Mg  Tabs (Furosemide) .... Once Daily 2)  Crestor 10 Mg  Tabs (Rosuvastatin Calcium) .... Take One Tablet Every Morning 3)  Benazepril Hcl 20 Mg Tabs (Benazepril Hcl) .Marland Kitchen.. 1 By Mouth Once Daily For Blood Pressure 4)  Warfarin Sodium 7.5 Mg Tabs (Warfarin Sodium) .... Take 1 Tablet By Mouth Once A Day 5)  Klor-Con 10 10 Meq Cr-Tabs (Potassium Chloride) .Marland Kitchen.. 1 Two Times A Day 6)  Lopressor 50 Mg  Tabs (Metoprolol Tartrate) .... Take 1 Tablet By Mouth Two Times A Day 7)  Metformin Hcl 1000 Mg  Tabs (Metformin Hcl) .Marland Kitchen.. 1 By Mouth Two Times A Day. 8)  Multivitamins   Tabs (Multiple Vitamin) .... Take 1 Tablet By Mouth Once A Day 9)  Xalatan 0.005 %  Soln (Latanoprost) .... As Directed 10)  Norvasc 10 Mg  Tabs (Amlodipine Besylate) .... Take 1 Tablet By Mouth Once A Day 11)  Zolpidem Tartrate 10 Mg  Tabs (Zolpidem Tartrate) .Marland Kitchen.. 1 By Mouth At Bedtime As Needed 12)  Fenofibrate 160 Mg Tabs (Fenofibrate) .... One By Mouth Daily 13)  Glipizide 10 Mg Tabs (Glipizide) .... Take 1/2 Tablet By Mouth Two Times A Day 14)  Vitamin C Cr 500 Mg Cr-Tabs (Ascorbic Acid) .... Take One Tablt  Two Times A  Day 15)  Promethazine-Codeine 6.25-10 Mg/23ml Syrp (Promethazine-Codeine)  Allergies (verified): 1)  ! * Niaspan  Past History:  Past Medical History: Reviewed history from 06/15/2009 and no changes required. BACK PAIN (ICD-724.5) HYPERTENSION (ICD-401.9) HYPERLIPIDEMIA (ICD-272.4) DIABETES MELLITUS, TYPE II (ICD-250.00) CORONARY ARTERY DISEASE (ICD-414.00) GLAUCOMA NEC (ICD-365.89) ROSACEA (ICD-695.3) Adenomatous polyp 10/22/2003  Past Surgical History: Reviewed history from 12/08/2009 and no changes required. 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 Jude's valve 2000  Review of Systems       As stated in the HPI and negative for all other systems.   Vital Signs:  Patient profile:   65 year old male Height:      71 inches Weight:      187 pounds BMI:     26.18 Pulse rate:   63 / minute Resp:     18 per minute BP sitting:   136 / 70  (right arm)  Vitals Entered By: Marrion Coy, CNA (December 06, 2010 4:17 PM)  Physical Exam  General:  Well developed, well nourished, in no acute distress. Head:  normocephalic and atraumatic Eyes:  vision grossly intact, pupils equal, pupils round, corneas and lenses clear, and no injection.   Neck:  Neck supple,  no JVD. No masses, thyromegaly or abnormal cervical nodes. Chest Wall:  well healed sternotomy scar. No deformities Lungs:  Normal respiratory effort, chest expands symmetrically. Lungs are clear to auscultation, no crackles or wheezes. Abdomen:  Bowel sounds positive; abdomen soft and non-tender without masses, organomegaly, or hernias noted. No hepatosplenomegaly. Msk:  Back normal, normal gait. Muscle strength and tone normal. Extremities:  No clubbing or cyanosis. Neurologic:  Alert and oriented x 3. Skin:  Intact without lesions or rashes. Cervical Nodes:  no significant  adenopathy Inguinal Nodes:  no significant adenopathy Psych:  Normal affect.   Detailed Cardiovascular Exam  Neck    Carotids: Carotids full and equal bilaterally without bruits.      Neck Veins: Normal, no JVD.    Heart    Inspection: no deformities or lifts noted.  no deformities and no heaving/lifts.      Palpation: normal PMI with no thrills palpable.      Auscultation: S1 within normal limits, mechanical S2, 2/6 apical systolic murmur radiating out aortic outflow tract, no diastolic murmur  Vascular    Abdominal Aorta: no palpable masses, pulsations, or audible bruits.      Femoral Pulses: normal femoral pulses bilaterally.      Pedal Pulses: 2+ radial and PT pulses    Radial Pulses: normal radial pulses bilaterally.      Peripheral Circulation: no clubbing, cyanosis, or edema noted with normal capillary refill.     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 I will repeat an echocardiogram at that time. He will continue with his own Coumadin therapy and understands the risks benefits of home monitoring. He has clearly accepted this risk He understands the need for compliance.  Problem # 2:  CORONARY ARTERY DISEASE (ICD-414.00) He has had no new symptoms. I will do routine screening stress perfusion imaging next year as it will have been 5 years since his last evaluation. He now has a 67 year old bypass grafts. He understands the importance of communicating with me if he has any symptoms between now and then.He will continue with risk reduction.  Problem # 3:  DECREASED SEXUAL DESIRE (ICD-799.81) I sent a message to Dr. Debby Bud as the patient was inquiring about a low testosterone level.  Problem # 4:  HYPERLIPIDEMIA (ICD-272.4) We  reviewed his lipid profile. He understands the lower carbohydrates and better blood sugar control will contribute to improve triglycerides and HDL.  Patient Instructions: 1)  Your physician recommends that you schedule a follow-up appointment in: 1 year with Dr Antoine Poche 2)  Your physician recommends that you continue on your current medications as directed. Please refer to the Current Medication list given to you today. 3)  Your physician has requested that you have an echocardiogram in 1 year.  Echocardiography is a painless test that uses sound waves to create images of your heart. It provides your doctor with information about the size and shape of your heart and how well your heart's chambers and valves are working.  This procedure takes approximately one hour. There are no restrictions for this procedure. 4)  Your physician has requested that you have an exercise tolerance test in 1 yr.  For further information please visit [email or web address]  Please  also follow instruction sheet, as given.

## 2011-01-13 ENCOUNTER — Other Ambulatory Visit: Payer: Self-pay | Admitting: Internal Medicine

## 2011-02-17 ENCOUNTER — Other Ambulatory Visit: Payer: Self-pay | Admitting: Internal Medicine

## 2011-02-19 ENCOUNTER — Telehealth: Payer: Self-pay | Admitting: *Deleted

## 2011-02-19 MED ORDER — ROSUVASTATIN CALCIUM 10 MG PO TABS: 10.0000 mg | ORAL_TABLET | Freq: Every day | ORAL | Status: DC

## 2011-02-19 NOTE — Telephone Encounter (Signed)
refill 

## 2011-02-27 NOTE — Assessment & Plan Note (Signed)
[facility]                            CARDIOLOGY OFFICE NOTE   Troy Moore, Troy Moore                       MRN:          [id number]  DATE:12/11/2007                            DOB:          09-22-1946    PRIMARY:  Dr. Illene Regulus.   REASON FOR PRESENTATION:  Patient with dyspnea, coronary artery disease  and valve disease.   HISTORY OF PRESENT ILLNESS:  The patient is a pleasant 65 year old  gentleman, who presents for follow-up of the above.  At the last visit  he was complaining of some dyspnea.  I did a stress perfusion study  which was a low-risk scan and did not demonstrate any high-grade areas  of ischemia.  There was a slight inferior defect that could have been  some ischemia.  EF was 54%.  He also had an echocardiogram which  demonstrated well-preserved ejection fraction and normally functioning  mechanical aortic valve.   Since that time the patient has done better.  He does not have the same  dyspnea that he was having at that time.  He is not having any resting  shortness of breath, PND or orthopnea.  He is not having any chest pain,  neck or arm discomfort.  He is not having palpitations, presyncope,  syncope.  He has not been as active as I would like.  His triglycerides  were elevated and his sugar was elevated.  However, he is somewhat hard  to get ahold of,  and did not get follow-up with Dr. Debby Bud, because of  this reason.   PAST MEDICAL HISTORY:  Coronary artery disease (95% LAD stenosis, 75%  mid LAD stenosis, and 50% proximal circumflex stenosis, small first  obtuse marginal with ostial 90% stenosis, right coronary artery with  diffuse disease with proximal 25% stenosis, mid 50% stenosis), well-  preserved ejection fraction (60%), mild aortic stenosis with bicuspid  aortic valve (now status post St. Jude mechanical valve replacement.  He  had a Bentall Procedure May 2000 with a LIMA to the LAD, SVG to  circumflex, and a  SVG to the right coronary artery), hypertension,  dyslipidemia, diabetes mellitus.   ALLERGIES:  The patient had a rash and itching with Niaspan   MEDICATIONS:  Lotensin 10 mg daily, Crestor 10 mg daily, Furosemide 40  mg daily, hydrochlorothiazide 12.5 mg daily, Norvasc 10 mg daily,  Xalatan, multivitamin, metformin 500 mg b.i.d., Glucotrol 5 mg daily,  Actos 30 mg daily, K-Dur 20 mEq daily, Coumadin, metoprolol 50 mg  b.i.d..   REVIEW OF SYSTEMS:  As stated in the HPI and otherwise negative for  other systems.   PHYSICAL EXAMINATION:  The patient is in no distress.  The blood  pressure 145/79, heart 79 and regular, weight 184 pounds.  HEENT:  Eyes unremarkable, pupils equal, round  and reactive to light,  fundi not visualized, oral mucosa unremarkable.  NECK:  No jugular venous distention at 45 degrees, carotid upstroke  brisk and symmetric, no bruits, thyromegaly.  LYMPHATICS:  Unremarkable.  LUNGS:  Clear to auscultation bilaterally.  BACK:  No costovertebral angle tenderness.  CHEST:  Well-healed sternotomy scar.  HEART:  PMI not displaced or sustained, S1 within normal limits,  mechanical crisp S2, no S3, S4, no clicks, no rubs, no murmurs.  ABDOMEN:  Flat, positive bowel sounds, normal in frequency and pitch, no  bruits, rebound, guarding, no midline pulsatile mass, no organomegaly.  SKIN:  No rashes.  EXTREMITIES:  2+ pulse, no cyanosis, clubbing, or edema.  NEURO:  Oriented to person, place, time, cranial nerves II-XII grossly  intact, motor grossly intact.   EKG sinus rhythm, right bundle branch block, QT slightly prolonged,  premature ventricular contraction.   ASSESSMENT/PLAN:  1. Dyspnea.  This is a longer a significant issue.  No further cardiac      workup is suggested.  He will continue with current therapies.  2. Coronary artery disease as above.  3. Hypertension.  Blood pressure is slightly elevated.  He does agree      to keep a blood pressure diary.  He  may need more ACE inhibitor.      He will fax it to Dr. Debby Bud or myself.  4. Hyperglycemia.  I called Dr. Debby Bud  today.  He is going to see the      patient this morning as a work-in for management of his diabetes      and his hypertriglyceridemia.  5. Dyslipidemia as above.  6. Status post aortic valve replacement.  The patient has been coming      in for his Coumadin every 6-8 weeks.  I told him this is a      dangerous and he needs to come in at regular periods with Dr.      Jimmey Ralph.  He agrees to try to do better.  He will continue the      Coumadin per [facility].  7. Followup. I will see him back in 12 months or sooner if needed.     Rollene Rotunda, MD, [facility]  Electronically Signed    JH/MedQ  DD: 12/11/2007  DT: 12/11/2007  Job #: [id number]   cc:   Rosalyn Gess. Norins, MD

## 2011-02-27 NOTE — Assessment & Plan Note (Signed)
[facility]                           PRIMARY CARE OFFICE NOTE   KOVEN, BELINSKY                       MRN:          [id number]  DATE:05/22/2007                            DOB:          [birth year]    Troy Moore is a 65 year old Caucasian gentleman well known to the  practice, who presents for followup evaluation and examination.  He was  last seen March 22, 2006.  In the interval, he has been followed by Dr.  Antoine Poche on a regular basis with his last office visit being January 23, 2007.  At that time, he was thought to be cardiac stable.   PAST MEDICAL HISTORY:   SURGICAL:  1. Appendectomy.  2. Deviated septum repair.  3. CABG with LIMA to the LAD, SVG to RCA and obtuse marginal, May of      2000.  4. Aortic valve replacement using a Bentall procedure, May of 2000.  5. Cholecystectomy, March of 2003.   MEDICAL ILLNESSES:  1. UCD.  2. NIDDM.  3. Hyperlipidemia.  4. Hypertension.  5. CAD.  6. Glaucoma.  7. Rosacea.   CURRENT MEDICATIONS:  1. Metoprolol 50 mg b.i.d.  2. Coumadin as directed.  3. Potassium 20 mEq daily.  4. Actos 30 mg daily.  5. Glucotrol 5 mg daily.  6. Metformin 500 mg b.i.d.  7. Multivitamin daily.  8. Xalatan eye drops.  9. Norvasc 10 mg daily.  10.Hydrochlorothiazide 12.5 mg daily.  11.Furosemide 40 mg daily.  12.Crestor 10 mg daily.  13.Lotensin 10 mg daily.  14.Benazepril daily.  15.Metrogel on a p.r.n. basis.   FAMILY HISTORY:  Significant for CAD in his father, otherwise  noncontributory.   SOCIAL HISTORY:  The patient was married for many years, divorced.  He  has been in a long-term monogamous relationship for 24+ years.  He has 3  grown daughters.  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  He is  considering cutting down some of his commuting back and forth to  [place].   REVIEW OF SYSTEMS:  No fevers, sweats or chills.   Weight has been  stable.  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  No ophthal, ENT, cardiovascular, or respiratory  complaint.  The patient has had loose stools since his gallbladder was  removed.  No GU, musculoskeletal, dermatologic or neurologic complaints.   PHYSICAL EXAMINATION:  Temperature was 97.2, blood pressure was 118/66,  pulse 64, weight 189, height is 5 feet 11 inches.  GENERAL APPEARANCE:  This is a well-nourished, well-developed gentleman  who appears to be in good shape.  HEENT EXAM:  Normocephalic, atraumatic.  EACs and TMs were unremarkable.  Oropharynx with native dentition in good repair.  No buccal or palate  lesions were noted.  Posterior pharynx was clear.  Conjunctivae and  sclerae were clear.  PERRLA, EOMI.  Funduscopic exam was unremarkable.  NECK:  Supple without thyromegaly.  NODES:  No lymphadenopathy was noted in the cervical, supraclavicular  regions.  CHEST:  No CVA tenderness.  He has a well-healed sternotomy scar.  LUNGS:  Clear with no rales, wheezes, or rhonchi.  CARDIOVASCULAR:  The patient has a soft mechanical valve murmur at the  right sternal border.  He had no JVD or carotid bruits.  ABDOMEN:  Well-healed surgical scars.  No guarding, no rebound.  No  organosplenomegaly was appreciated.  RECTAL EXAM:  Normal sphincter tone was noted.  Prostate was smooth and  flat with no nodules or abnormalities noted.  EXTREMITIES:  Without cyanosis, clubbing, or edema.  He has vein  harvesting scars.  NEUROLOGIC EXAM:  Nonfocal.   LABORATORY DATA:  Ordered and pending.  It includes a lipid panel, AST,  ALT, metabolic panel, hemoglobin A1c.  He will be notified by phone tree  as to his lab results.   ASSESSMENT AND PLAN:  Cardiovascular:  The patient is stable and  followed on a regular basis by Dr. Antoine Poche.  His last stress test was  February 12, 2007 which was read out as having significant inferolateral ST   depression consistent with ischemia.  Nuclear images raised the question  of slightly inferior ischemia.  Wall motion suggests prior bypass  surgery and mild scarring.   A 2D echo performed March 30 read out as showing normal left ventricular  size and the ejection fraction of 60%.  No diagnostic evidence of left  ventricular regional wall motion abnormality.  His prosthetic aortic  valve was well seated and functioning normally.  There was no  perivalvular aortic regurgitation with a transaortic valvular gradient  at 12 mmHg.  Normal mitral valve.   PLAN:  1. The patient to continue his followup with Dr. Antoine Poche as      instructed.  2. Hypertension:  The patient's blood pressure is very well      controlled.  I will have the patient review his medication list      because of question of redundancy, particularly Lotensin and      benazepril.  Nonetheless, patient will continue his present medical      regimen.  3. Non-insulin dependent diabetes:  The patient's last hemoglobin A1c      from March 21, 2005 was 6.8%.  New laboratory is pending and will      adjust his medications as indicated.  4. Glaucoma:  The patient follows with his ophthalmologist.  5. Rosacea:  The patient has a flare of rosacea at this time.  He says      it is usually exacerbated by heat.  He will continue his Metrogel.  6. Health maintenance:  The patient's last colonoscopy, October 22, 2003, with colon polyps, one being adenomatous, and 2 polypoid      fragments being benign colonic mucosa.  Followup will be in 2010.  7. Immunizations are up to date with pneumonia vaccine, June of 2007,      Zostavax, June of 2007.  The patient would be a candidate for a      tetanus booster at his convenience.  8. The patient last had a chest x-ray in 2000 by our records.  He      would be a candidate for a followup chest x-ray at his convenience.   In summary, he is a very pleasant gentleman with medical problems as   outlined above.  He does seem stable at this time.  He will be notified  by phone tree of his lab result and any medical recommendation.  He  should review his medication list in this note.  Contact me if there are  any errors.     Rosalyn Gess Norins, MD  Electronically Signed    MEN/MedQ  DD: 05/23/2007  DT: 05/23/2007  Job #: [id number]   cc:   Willadean Carol

## 2011-03-02 NOTE — Op Note (Signed)
[place]  Patient:    Troy Moore, Troy Moore Visit Number: [id number] MRN: [id number]          Service Type: DSU Location: [phone] Attending Physician:  Caleen Essex Dictated by:   Ollen Gross. Vernell Morgans, M.D. Proc. Date: 01/02/02 Admit Date:  01/02/2002 Discharge Date: 01/03/2002                             Operative Report  PREOPERATIVE DIAGNOSIS:  Cholecystitis.  POSTOPERATIVE DIAGNOSIS:  Cholecystitis.  PROCEDURE:  Laparoscopic cholecystectomy.  SURGEON:  Ollen Gross. Vernell Morgans, M.D.  ASSISTANT:  Zigmund Daniel, M.D.  ANESTHESIA:  General endotracheal.  DESCRIPTION OF PROCEDURE:  After informed consent was obtained, the patient was brought to the operating room and placed in the supine position on the operating table.  After adequate induction of general anesthesia, the patients abdomen was prepped with Betadine and draped in the usual sterile manner.  The area above the umbilicus was infiltrated with 0.25% Marcaine, and a small transverse incision was made with the 15 blade knife.  This incision was carried down through the subcutaneous tissue using blunt dissection with the Kelly clamp and Army-Navy retractors until the linea alba was identified. The linea alba was incised with the 15 blade knife and each side was grasped with Kocher clamps and elevated.  The preperitoneal space was then probed bluntly with the hemostat until the peritoneum was opened and access was gained to the abdominal cavity.  A finger was inserted through the _______ at the anterior abdominal wall and there were no apparent adhesions.  A 0 Vicryl pursestring stitch was then placed in the fascia surrounding this opening.  A Hasson cannula was then placed through this opening and anchored in place with the previously placed Vicryl pursestring stitch.  The abdomen was then insufflated with carbon dioxide without difficulty.  The patient was placed in the head up  position.  A small upper midline transverse incision was made with the 15 blade knife after infiltration of this area with 0.25% Marcaine.  A 10 mm port was then placed bluntly through this incision into the abdominal cavity under direct vision.  Sites were then chosen laterally on the right side of the abdomen for placement of 5 mm ports, and each of these areas was infiltrated with 0.25% Marcaine, and small stab incisions were made with the 15 blade knife, and 5 mm ports were then placed bluntly through these incisions into the abdominal cavity under direct vision. A blunt grasper was placed at the lateral most 5 mm port and used to grasp the dome of the gallbladder and elevate it anteriorly and superiorly.  Another blunt grasper was placed at the other 5 mm port and used to retract on the body and neck of the gallbladder.  A [place] dissector was placed through the upper midline port and using the Bovie electrocautery, the peritoneal reflection of the gallbladder neck area was opened.  Blunt dissection was then carried out in this area until the gallbladder neck and cystic duct junction was readily identified and dissected in a circumferential manner until a nice window was created.  Three clips were then placed proximally and one distally on the cystic duct, and the duct was divided between the two.  Posterior to this, the cystic artery was identified and dissected, again in a circumferential manner until a good window was created.  Two clips were  placed proximally and one distally in the artery, and the artery was divided between the two.  Using laparoscopic hook cautery, the gallbladder was separated from the liver bed.  One small bleeding vessel did require extra clips in this area.  Prior to completely detaching the gallbladder from the liver bed, the liver bed was inspected, and was found to be hemostatic.  The gallbladder was then detached the rest of the way from the liver bed.   An endoscopic bag was then placed through the upper midline port, and the gallbladder was placed into the bag, and the bag was sealed.  The laparoscope was then moved to the upper midline port and a gallbladder grasper was placed through the Hasson cannula and used the grasp the opening of the bag, and the bag with the gallbladder was removed with the Hasson cannula through the supraumbilical port.  The fascial defect was then closed with the previously placed Vicryl pursestring stitch under direct vision.  The abdomen was then irrigated with copious amounts of saline until the effluent was clear.  The liver bed was examined again and found to be hemostatic.  The patient tolerated the procedure well.  The ports were then all removed under direct vision and gas was allowed to escape.  The skin incisions were then closed with interrupted 4-0 Monocryl subcuticular stitches.  Benzoin and Steri-Strips were applied.  The patient tolerated the procedure well.  At the end of the case, all needle, sponge, and instrument counts were correct.  The patient was awakened and taken to the recovery room in stable condition. Dictated by:   Ollen Gross. Vernell Morgans, M.D. Attending Physician:  Caleen Essex DD:  01/02/02 TD:  01/05/02 Job: [id number]

## 2011-03-02 NOTE — Op Note (Signed)
[facility]  Patient:    Troy Moore, Troy Moore Visit Number: [id number] MRN: [id number]          Service Type: DSU Location: [phone] Attending Physician:  Caleen Essex Dictated by:   Ollen Gross. Vernell Morgans, M.D. Proc. Date: 01/02/02 Admit Date:  01/02/2002 Discharge Date: 01/03/2002                             Operative Report  INCOMPLETE DICTATION  PREOPERATIVE DIAGNOSES:  Cholelithiasis.  POSTOPERATIVE DIAGNOSES:  Cholelithiasis.  PROCEDURE:  Laparoscopic cholecystectomy. Dictated by:   Ollen Gross. Vernell Morgans, M.D. Attending Physician:  Caleen Essex DD:  01/12/02 TD:  01/12/02 Job: [id number]

## 2011-03-02 NOTE — Assessment & Plan Note (Signed)
[facility]                            CARDIOLOGY OFFICE NOTE   Troy Moore, Troy Moore                       MRN:          [id number]  DATE:10/24/2006                            DOB:          Apr 24, 1946    REFERRING PHYSICIAN:  Rosalyn Gess. Norins, MD   REASON FOR PRESENTATION:  Patient with coronary disease and aortic valve  replacement.   HISTORY OF PRESENT ILLNESS:  Patient is a pleasant 65 year old gentleman  now with disease as described below.  In the last year he has done well.  He has a little dyspnea walking up an incline, but recovers very quickly  from this.  This is chronic and has not been causing any new symptoms.  He has not been having any PND or orthopnea.  He has not been having any  chest discomfort, neck discomfort, arm discomfort, activity-induced  nausea, vomiting, excessive diaphoresis.  He has not bee noticing any  palpitations, presyncope or syncope.  He does not exercise as much as I  would like.   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  He had a Bentall procedure in May 2000 with a LIMA to the LAD, SVG to  the circumflex and SGT to the right coronary artery,) hypertension,  hyperlipidemia, diabetes mellitus.   ALLERGIES:  Patient developed a rash and itching with NIASPAN.   MEDICATIONS:  1. Lotensin 20 mg daily.  2. Crestor 10 mg daily.  3. Furosemide 40 mg daily.  4. Hydrochlorothiazide 12.5 mg daily.  5. Norvasc 10 mg daily.  6. Xalatan eye drops.  7. Multivitamins.  8. Metformin 500 mg b.i.d.  9. Glucotrol 5 mg daily.  10.Actos 30 mg  daily.  11.K-Dur 20 mg daily.  12.Coumadin per [facility].  13.Lopressor 50 mg b.i.d.   REVIEW OF SYSTEMS:  As stated in the HPI, otherwise negative for other  systems.   PHYSICAL EXAMINATION:  The patient is in no distress.  Blood pressure 144/83.  Heart rate is 71 and regular.  Weight 188  pounds.  Body mass index 26.  HEENT:  Eyes unremarkable.  Pupils equal, round and reactive to light.  Fundi not visualized.  Oral mucosa unremarkable.  NECK:  No jugular venous distention at 45 degrees.  Carotid upstroke  brisk and symmetric.  No bruits.  No thyromegaly.  LYMPHATICS:  No adenopathy.  LUNGS:  Clear to auscultation bilaterally.  BACK:  No costovertebral angle tenderness.  CHEST:  Well-healed sternotomy scar.  HEART:  PMI not displaced or sustained.  S1 within normal limits.  Mechanical S2 crisp.  No S3.  No S4.  No murmurs.  ABDOMEN:  Flat, positive bowel sounds.  Normal in frequency and pitch.  No bruits.  No rebound.  No  guarding.  No midline pulsatile mass.  No  organomegaly.  SKIN:  No rashes.  No nodules.  EXTREMITIES:  Two plus pulses throughout.  No edema.  No cyanosis or  clubbing.  NEUROLOGIC:  Oriented to person, place and time.  Cranial nerves 2  through 12 grossly intact.  Motor grossly intact.   EKG sinus rhythm.  Rate 71.  Right bundle branch block.  No change from  previous EKGs.   ASSESSMENT AND PLAN:  1. Aortic valve replacement.  The is doing well with respect to this.      He is encouraged to come and get his Coumadin checked routinely.      He has been much better about this.  I will defer management to the      [facility].  2. Coronary disease.  He had a stress perfusion study in 2004.  He has      had no new symptoms.  No further cardiovascular testing is      suggested at this point.  I would probably do it in 1 year, which      would be 5 years between studies in a patient who had minimal      symptoms at the time of his diagnosis.  3.  Hypertension.  Blood pressure is still not at target.  I am going      to increase his Lotensin to 10 mg daily.  If he finds out he is      lightheaded or his blood pressure will allow going forward, it      might be prudent to discontinue the hydrochlorothiazide, as he is      on 2 diuretics.  4. Dyslipidemia.  He is going to see Dr. Debby Bud in June.  He wants to      have a lipid profile done before this, and I will arrange that.  5. Followup.  I will see him back in 1 year, or sooner if needed.     Rollene Rotunda, MD, [facility]  Electronically Signed    JH/MedQ  DD: 10/24/2006  DT: 10/24/2006  Job #: [id number]   cc:   Rosalyn Gess. Norins, MD

## 2011-03-02 NOTE — Assessment & Plan Note (Signed)
[facility]                            CARDIOLOGY OFFICE NOTE   Troy Moore, Troy Moore                       MRN:          [id number]  DATE:01/23/2007                            DOB:          02-Jan-1946    PRIMARY:  Dr. Illene Regulus.   REASON FOR PRESENTATION:  Evaluate patient with dyspnea.   HISTORY OF PRESENT ILLNESS:  The patient is a pleasant 65 year old  gentleman who has a history of coronary disease and valve replacement as  described below.  He had been doing well when I saw him in January.  I  did manipulate his medicines a little bit for blood pressure control.  However, he now has developed some shortness of breath.  He did not  notice this in January.  However, he slowly evolved to have dyspnea with  moderate exertion.  He gets short of breath climbing a flight of stairs,  which is new for him.  He does not have to stop at the top of the  stairs, but he does feel winded.  He does not have any resting shortness  of breath.  Denies any PND or orthopnea.  He has some atypical  occasional left upper chest discomfort only when he takes a deep breath.  He has no substernal chest pressure.  He has no neck or arm discomfort.  He is not having any palpitations, pre-syncope, or syncope.  He feels a  little more fatigued.   PAST MEDICAL HISTORY:  Coronary artery disease (95% LAD stenosis, 75%  mid LAD stenosis, and a 50% proximal circumflex stenosis, small first  obtuse marginal with osteal 90% stenosis, right coronary artery with  diffuse disease with proximal 25% stenosis, mid 50% stenosis), well-  preserved ejection fraction (60%).  Mild aortic stenosis with a bicuspid  aortic valve (he is now status post St. Jude mechanical valve  replacement.  He had a Bentall procedure in May of 2000 with a LIMA to  the LAD, SVG to circumflex, and an SVG to the right coronary artery.  Hypertension.  Hyperlipidemia.  Diabetes mellitus.   ALLERGIES:  The  patient developed a rash and itching with NIASPAN.   MEDICATIONS:  1. Lotensin 10 mg daily.  2. Crestor 10 mg daily.  3. Furosemide 40 mg daily.  4. Hydrochlorothiazide 12.5 mg daily.  5. Norvasc 10 mg daily.  6. Xalatan.  7. Multivitamin.  8. Metformin 500 mg b.i.d.  9. Glucotrol 5 mg daily.  10.Actos 30 mg daily.  11.K-Dur 20 mEq daily.  12.Lopressor 50 mg b.i.d.   REVIEW OF SYSTEMS:  As stated in the HPI and otherwise negative for  other systems.   PHYSICAL EXAMINATION:  The patient is in no distress.  Blood pressure 142/80, heart rate 72 and regular, weight 191 pounds,  body mass index 26.  HEENT:  Eyelids unremarkable.  Pupils are equal, round, and reactive to  light.  Fundi not visualized.  Oral mucosa unremarkable.  NECK:  No jugular venous distension at 45 degrees, carotid upstroke  brisk and symmetric, no bruits, thyromegaly.  LYMPHATICS:  No adenopathy.  LUNGS:  Clear to auscultation bilaterally.  BACK:  No costovertebral angle tenderness.  CHEST:  Well-healed sternotomy scar.  HEART:  PMI not displaced or sustained, S1 within normal limits,  mechanical crisp S2, no S3, no S4, no murmurs.  ABDOMEN:  Flat, positive bowel sounds, normal in frequency and pitch, no  bruits, rebound, guarding.  No midline pulsatile masses, no  organomegaly.  SKIN:  No rashes, no nodules.  EXTREMITIES:  With 2+ pulses throughout, no edema, cyanosis, clubbing.  NEURO:  Oriented to person, place, and time, cranial nerves 2-12 grossly  intact, motor grossly intact.   ELECTROCARDIOGRAM:  Sinus rhythm, rate 64, first degree AV block, right  bundle branch block, lateral T wave inversions unchanged from previous.   ASSESSMENT AND PLAN:  1. Dyspnea.  The patient's dyspnea is new.  Given this, he needs      another echocardiogram and a stress perfusion study to rule out      ischemia or any change in his left ventricular function or valve      function.  Further evaluation will be based on  these results.  2. Coronary artery disease as above.  3. Hypertension.  Blood pressure is slightly better on an increased      dose of Norvasc.  He will continue with this.  4. Hyperglycemia.  The patient's glucose when he came back for BMET in      January was 255.  He is having his sugars followed by Dr. Debby Bud.      I will defer to his management.  Due to see him in the next several      weeks for followup of his blood sugar.  5. Dyslipidemia.  The patient's triglycerides are 511 with an HDL of      37.1.  This probably reflects poor sugar control.  His LDL was 102.      He will remain on the current dose of Crestor.     Rollene Rotunda, MD, [facility]  Electronically Signed    JH/MedQ  DD: 01/23/2007  DT: 01/23/2007  Job #: [id number]   cc:   Rosalyn Gess. Norins, MD

## 2011-03-23 ENCOUNTER — Telehealth: Payer: Self-pay | Admitting: *Deleted

## 2011-03-23 MED ORDER — AZITHROMYCIN 250 MG PO TABS: ORAL_TABLET | ORAL | Status: AC

## 2011-03-23 NOTE — Telephone Encounter (Signed)
Patient informed. 

## 2011-03-23 NOTE — Telephone Encounter (Signed)
Ok for z-pak for URI, may call to his [place] [facility]

## 2011-03-23 NOTE — Telephone Encounter (Signed)
Patient requesting RX for antibiotic from MD. He is c/o severe sore throat, sinus drainage and chills x 2 days. Pt has tried sudafed w/no relief.   I offered apt, pt says he is in [place] and would like RX if possible but would drive here for eval if MD requested.

## 2011-04-02 ENCOUNTER — Other Ambulatory Visit: Payer: Self-pay | Admitting: Cardiology

## 2011-05-02 ENCOUNTER — Other Ambulatory Visit: Payer: Self-pay | Admitting: Internal Medicine

## 2011-05-03 ENCOUNTER — Other Ambulatory Visit: Payer: Self-pay | Admitting: *Deleted

## 2011-05-03 NOTE — Telephone Encounter (Signed)
A user error has taken place: encounter opened in error, closed for administrative reasons.  Refilled in separate encounter.

## 2011-05-04 ENCOUNTER — Telehealth: Payer: Self-pay

## 2011-05-04 NOTE — Telephone Encounter (Signed)
Patient called stating that he was transferred from scheduling to request an appt for a CPX. Last CPX was 06/02/10 and was told there are no appts available.

## 2011-05-05 NOTE — Telephone Encounter (Signed)
Work him in in August. thanks

## 2011-05-19 ENCOUNTER — Other Ambulatory Visit: Payer: Self-pay | Admitting: Internal Medicine

## 2011-05-21 ENCOUNTER — Other Ambulatory Visit: Payer: Self-pay | Admitting: *Deleted

## 2011-05-21 ENCOUNTER — Other Ambulatory Visit: Payer: Self-pay | Admitting: Internal Medicine

## 2011-05-21 MED ORDER — FUROSEMIDE 40 MG PO TABS: 40.0000 mg | ORAL_TABLET | Freq: Every day | ORAL | Status: DC

## 2011-06-04 ENCOUNTER — Other Ambulatory Visit: Payer: Self-pay | Admitting: Internal Medicine

## 2011-06-04 ENCOUNTER — Ambulatory Visit (INDEPENDENT_AMBULATORY_CARE_PROVIDER_SITE_OTHER): Payer: Medicare Other | Admitting: Internal Medicine

## 2011-06-04 ENCOUNTER — Other Ambulatory Visit (INDEPENDENT_AMBULATORY_CARE_PROVIDER_SITE_OTHER): Payer: Medicare Other

## 2011-06-04 ENCOUNTER — Encounter: Payer: Self-pay | Admitting: Internal Medicine

## 2011-06-04 DIAGNOSIS — E119 Type 2 diabetes mellitus without complications: Secondary | ICD-10-CM

## 2011-06-04 DIAGNOSIS — Z125 Encounter for screening for malignant neoplasm of prostate: Secondary | ICD-10-CM

## 2011-06-04 DIAGNOSIS — I2510 Atherosclerotic heart disease of native coronary artery without angina pectoris: Secondary | ICD-10-CM

## 2011-06-04 DIAGNOSIS — M549 Dorsalgia, unspecified: Secondary | ICD-10-CM

## 2011-06-04 DIAGNOSIS — I10 Essential (primary) hypertension: Secondary | ICD-10-CM

## 2011-06-04 DIAGNOSIS — R6882 Decreased libido: Secondary | ICD-10-CM

## 2011-06-04 DIAGNOSIS — Z954 Presence of other heart-valve replacement: Secondary | ICD-10-CM

## 2011-06-04 DIAGNOSIS — E785 Hyperlipidemia, unspecified: Secondary | ICD-10-CM

## 2011-06-04 DIAGNOSIS — Z0000 Encounter for general adult medical examination without abnormal findings: Secondary | ICD-10-CM

## 2011-06-04 LAB — CBC WITH DIFFERENTIAL/PLATELET
Basophils Relative: 0.4 % (ref 0.0–3.0)
Eosinophils Relative: 4.2 % (ref 0.0–5.0)
HCT: 39.5 % (ref 39.0–52.0)
Hemoglobin: 13.5 g/dL (ref 13.0–17.0)
Lymphs Abs: 1.2 10*3/uL (ref 0.7–4.0)
MCV: 91.7 fl (ref 78.0–100.0)
Monocytes Absolute: 0.4 10*3/uL (ref 0.1–1.0)
Monocytes Relative: 7.5 % (ref 3.0–12.0)
Neutro Abs: 3 10*3/uL (ref 1.4–7.7)
Platelets: 171 10*3/uL (ref 150.0–400.0)
RBC: 4.31 Mil/uL (ref 4.22–5.81)
WBC: 4.7 10*3/uL (ref 4.5–10.5)

## 2011-06-04 MED ORDER — TESTOSTERONE CYPIONATE 200 MG/ML IM SOLN: 400.0000 mg | INTRAMUSCULAR | Status: AC

## 2011-06-04 MED ORDER — PROMETHAZINE-CODEINE 6.25-10 MG/5ML PO SYRP: 5.0000 mL | ORAL_SOLUTION | ORAL | Status: DC | PRN

## 2011-06-04 NOTE — Progress Notes (Signed)
Subjective:    Patient ID: Troy Moore, male    DOB: 02-May-1946, 65 y.o.   MRN: [id number]  HPI The patient is here for annual Medicare wellness examination and management of other chronic and acute problems. He is feeling OK and doing OK except for decreased libido. Testosterone level was low at 274 1 year ago and he did not undertake replacement.    The risk factors are reflected in the social history.  The roster of all physicians providing medical care to patient - is listed in the Snapshot section of the chart.  Activities of daily living:  The patient is 100% inedpendent in all ADLs: dressing, toileting, feeding as well as independent mobility  Home safety : The patient has smoke detectors in the home. They wear seatbelts.  Firearms are present in the home, kept in a safe fashion. There is no violence in the home. No falls at home.  There is no risks for hepatitis, STDs or HIV. There is no   history of blood transfusion. They have no travel history to infectious disease endemic areas of the world.  The patient has seen dentist in the last 6 months.  Seen eye doctor in the last year, acutally seen every 5-6 months. NO hearing loss and no audiologic testing in the last year.  They do not  have excessive sun exposure. Discussed the need for sun protection: hats if there is significant sun exposure.   Diet: the importance of a healthy diet is discussed. They do have a healthy diet.  The patient has a regular exercise program: aerobic ,free exercise or machines  1 hr duration, 3-4 per week.  The benefits of regular aerobic exercise were discussed.  Depression screen: there are no signs or vegative symptoms of depression- irritability, change in appetite, anhedonia, sadness/tearfullness.  Cognitive assessment: the patient manages all their financial and personal affairs and is actively engaged. They could relate day,date,year and events; recalled 3/3 objects at 3 minutes.  The following  portions of the patient's history were reviewed and updated as appropriate: allergies, current medications, past family history, past medical history,  past surgical history, past social history  and problem list.  Vision, hearing, body mass index were assessed and reviewed.   During the course of the visit the patient was educated and counseled about appropriate screening and preventive services including : fall prevention , diabetes screening, nutrition counseling, colorectal cancer screening, and recommended immunizations.  Past Medical History  Diagnosis Date  . Back pain   . Hypertension   . Hyperlipidemia   . Diabetes mellitus type II   . CAD (coronary artery disease)   . Glaucoma   . Rosacea   . Adenomatous polyps     10/22/2003   Past Surgical History  Procedure Date  . Appendectomy   . Coronary artery bypass graft     bental procedure (AvR) : LIMA-LAD, SVG- Cx, RCA)  . Cholecystectomy     2003  . Nasal septum surgery   . Aortic valve replacement     St Jude's valve 2000   Family History  Problem Relation Age of Onset  . COPD Mother   . Cancer Maternal Uncle     prostate   History   Social History  . Marital Status: Married    Spouse Name: N/A    Number of Children: N/A  . Years of Education: N/A   Occupational History  . Not on file.   Social History Main Topics  .  Smoking status: Not on file  . Smokeless tobacco: Not on file  . Alcohol Use:   . Drug Use:   . Sexually Active:    Other Topics Concern  . Not on file   Social History Narrative   [employer]ried '68- 12 years divorced, long term relationship 79 years3 daughters-'70, '72, '75: 5 GrandchildrenWork: independent manPatient is a former smoker.  Stopped smoking over 25 years agoAlcohol use- noDaily caffeine use 4 cups per dayIllicit Drug use- noPatient gets regular excercise        Review of Systems Review of Systems  Constitutional:  Negative for fever, chills, activity change and  unexpected weight change.  HEENT:  Negative for hearing loss, ear pain, congestion, neck stiffness and postnasal drip. Negative for sore throat or swallowing problems. Negative for dental complaints.   Eyes: Negative for vision loss or change in visual acuity.  Respiratory: Negative for chest tightness and wheezing.   Cardiovascular: Negative for chest pain and palpitation. No decreased exercise tolerance Gastrointestinal: No change in bowel habit. No bloating or gas. No reflux or indigestion Genitourinary: Negative for urgency, frequency, flank pain and difficulty urinating.  Musculoskeletal: Negative for myalgias, back pain, arthralgias and gait problem.  Neurological: Negative for dizziness, tremors, weakness and headaches.  Hematological: Negative for adenopathy.  Psychiatric/Behavioral: Negative for behavioral problems and dysphoric mood.  Derm - has had several lesions removed by dermatology.      Objective:   Physical Exam Vital signs reviewed Gen'l: Well nourished well developed white male in no acute distress. Usual conversant self.  HEENT: wears glasses Head: Normocephalic and atraumatic.  Right Ear: External ear normal. EAC/TM nl Left Ear: External ear normal.  EAC/TM nl Nose: Nose normal.  Mouth/Throat: Oropharynx is clear and moist. Dentition - native, in good repair. No buccal or palatal lesions. Posterior pharynx clear. Eyes: Conjunctivae and sclera clear. EOM intact. Pupils are equal, round, and reactive to light. Right eye exhibits no discharge. Left eye exhibits no discharge. Neck: Normal range of motion. Neck supple. No JVD present. No tracheal deviation present. No thyromegaly present.  Cardiovascular: Normal rate, regular rhythm, no gallop,III/VI mechanical valve sound at RSB, II/VI systolic murmur at LSB    Quiet precordium. 2+ radial and DP pulses . No carotid bruits - audible valvular sounds Pulmonary/Chest: Effort normal. No respiratory distress or increased WOB,  no wheezes, no rales. Well healed sternotomy scar. No chest wall deformity or CVAT. Abdominal: Soft. Bowel sounds are normal in all quadrants. He exhibits no distension, no tenderness, no rebound or guarding, No heptosplenomegaly  Genitourinary:   Musculoskeletal: Normal range of motion. He exhibits no edema and no tenderness.       Small and large joints without redness, synovial thickening or deformity. Full range of motion preserved about all small, median and large joints.  Lymphadenopathy:    He has no cervical or supraclavicular adenopathy.  Neurological: He is alert and oriented to person, place, and time. CN II-XII intact. DTRs 2+ and symmetrical biceps, radial and patellar tendons. Cerebellar function normal with no tremor, rigidity, normal gait and station.  Skin: Skin is warm and dry. No rash noted. No erythema.  Psychiatric: He has a normal mood and affect. His behavior is normal. Thought content normal.   Lab Results  Component Value Date   WBC 4.7 06/04/2011   HGB 13.5 06/04/2011   HCT 39.5 06/04/2011   PLT 171.0 06/04/2011   CHOL 145 06/04/2011   TRIG 370.0* 06/04/2011   HDL 36.40*  06/04/2011   LDLDIRECT 65.1 06/04/2011   ALT 16 06/04/2011   ALT 16 06/04/2011   AST 20 06/04/2011   AST 20 06/04/2011   NA 138 06/04/2011   K 3.8 06/04/2011   CL 100 06/04/2011   CREATININE 1.2 06/04/2011   BUN 16 06/04/2011   CO2 29 06/04/2011   TSH 2.32 06/04/2011   PSA 0.50 06/04/2011   INR 3.4* 06/04/2011   HGBA1C 8.6* 06/04/2011           Assessment & Plan:

## 2011-06-05 LAB — COMPREHENSIVE METABOLIC PANEL
ALT: 16 U/L (ref 0–53)
AST: 20 U/L (ref 0–37)
Calcium: 9.4 mg/dL (ref 8.4–10.5)
Chloride: 100 mEq/L (ref 96–112)
Creatinine, Ser: 1.2 mg/dL (ref 0.4–1.5)
Sodium: 138 mEq/L (ref 135–145)
Total Bilirubin: 1 mg/dL (ref 0.3–1.2)
Total Protein: 7.1 g/dL (ref 6.0–8.3)

## 2011-06-05 LAB — LIPID PANEL
Cholesterol: 145 mg/dL (ref 0–200)
Total CHOL/HDL Ratio: 4
VLDL: 74 mg/dL — ABNORMAL HIGH (ref 0.0–40.0)

## 2011-06-05 LAB — HEPATIC FUNCTION PANEL
ALT: 16 U/L (ref 0–53)
Albumin: 4.8 g/dL (ref 3.5–5.2)
Alkaline Phosphatase: 55 U/L (ref 39–117)
Bilirubin, Direct: 0.2 mg/dL (ref 0.0–0.3)
Total Protein: 7.1 g/dL (ref 6.0–8.3)

## 2011-06-06 DIAGNOSIS — Z0000 Encounter for general adult medical examination without abnormal findings: Secondary | ICD-10-CM | POA: Insufficient documentation

## 2011-06-06 NOTE — Assessment & Plan Note (Signed)
Lab Results  Component Value Date   HGBA1C 8.6* 06/04/2011   Out of control diabetes! Regimen reviewed: biquanide and sulfonyurea  Plan - OV -will change to DPP4 or GLP-1 product, d/c glucotrol.

## 2011-06-06 NOTE — Assessment & Plan Note (Signed)
BP Readings from Last 3 Encounters:  06/04/11 132/60  12/06/10 136/70  06/02/10 128/70   Good control on present medications. NO change in regimen. Renal function and lytes normal

## 2011-06-06 NOTE — Assessment & Plan Note (Signed)
Triglycerides are elevated; HDL just below goal and consistent w/ prior labs; LDL 61 and excellent.  Plan - continue present medications           Life-style: he will need to do better in regard to fat in the diet. No medical intervention of glycerides.

## 2011-06-06 NOTE — Assessment & Plan Note (Signed)
Interval medical history is benign. He has remained active and is working on his health. Physical exam w/o acute findings. Lab results: glucose 257 and A1c 8.6% - see probs. Lipids are good. INR above therapeutic range. The remainder of the labs are normal. He is current with colorectal cancer screening with last study Sept '10. Immunizations: shingles vaccine June '07; due Tdap and Pneumo.  In summary- a very nice man who has no complaints. His condition is stable. Will need to work on Diabetes control. ROV for  Testosterone replacement and OV

## 2011-06-06 NOTE — Assessment & Plan Note (Signed)
Stable and doing well. Monitoring  INR at home and has no problems. INR today is above therapeutic range but no correction needed.  Plan - he will follow-up w/ Cardiology

## 2011-06-06 NOTE — Assessment & Plan Note (Signed)
Patient with hypogonadism with low testosterone. At this time he is interested in testosterone replacement. Discussed the various treatment products.  Plan - depo-testosterone injections 400mg  monthly

## 2011-06-07 ENCOUNTER — Other Ambulatory Visit: Payer: Self-pay | Admitting: Internal Medicine

## 2011-07-01 ENCOUNTER — Other Ambulatory Visit: Payer: Self-pay | Admitting: Internal Medicine

## 2011-07-16 ENCOUNTER — Telehealth: Payer: Self-pay | Admitting: *Deleted

## 2011-07-16 NOTE — Telephone Encounter (Signed)
Patient requesting RX for "a pack" - C/o flu like symptoms x 8 days. Needs OV.

## 2011-07-17 NOTE — Telephone Encounter (Signed)
Informed pt .

## 2011-07-17 NOTE — Telephone Encounter (Signed)
Usually do not use zpak for flu. Suggest either ov here or if he is in [place] an urgent care there

## 2011-08-17 ENCOUNTER — Other Ambulatory Visit: Payer: Self-pay | Admitting: Internal Medicine

## 2011-08-29 ENCOUNTER — Encounter: Payer: Self-pay | Admitting: Internal Medicine

## 2011-09-29 ENCOUNTER — Other Ambulatory Visit: Payer: Self-pay | Admitting: Internal Medicine

## 2011-11-16 ENCOUNTER — Other Ambulatory Visit: Payer: Self-pay | Admitting: Internal Medicine

## 2011-12-10 ENCOUNTER — Encounter: Payer: Self-pay | Admitting: Cardiology

## 2011-12-10 ENCOUNTER — Ambulatory Visit (INDEPENDENT_AMBULATORY_CARE_PROVIDER_SITE_OTHER): Payer: Medicare Other | Admitting: Cardiology

## 2011-12-10 VITALS — BP 159/75 | HR 71 | Ht 71.0 in | Wt 181.0 lb

## 2011-12-10 DIAGNOSIS — I2510 Atherosclerotic heart disease of native coronary artery without angina pectoris: Secondary | ICD-10-CM

## 2011-12-10 DIAGNOSIS — E119 Type 2 diabetes mellitus without complications: Secondary | ICD-10-CM

## 2011-12-10 DIAGNOSIS — Z954 Presence of other heart-valve replacement: Secondary | ICD-10-CM

## 2011-12-10 DIAGNOSIS — E785 Hyperlipidemia, unspecified: Secondary | ICD-10-CM

## 2011-12-10 DIAGNOSIS — I10 Essential (primary) hypertension: Secondary | ICD-10-CM

## 2011-12-10 NOTE — Progress Notes (Signed)
HPI The patient presents for yearly followup. Since I last saw him he has done well. He has had no new cardiovascular complaints. He remains active exercising routinely with a aerobic exercise. With this he denies any chest pressure, neck or arm discomfort. He denies any palpitations, presyncope or syncope. He has no shortness of breath, PND or orthopnea. He has had no weight gain or edema. He has had a very low-grade fever for a couple of days. He's just not felt well for about 7 days. However, he denies any localizing symptoms such as cough or sputum production. He has had no dysuria pyuria or polyuria. He's had no rash or chills.  Allergies  Allergen Reactions  . Niacin     Current Outpatient Prescriptions  Medication Sig Dispense Refill  . amLODipine (NORVASC) 10 MG tablet TAKE ONE TABLET BY MOUTH ONE TIME DAILY  90 tablet  4  . benazepril (LOTENSIN) 20 MG tablet TAKE ONE TABLET BY MOUTH EVERY DAY FOR BLOOD PRESSURE  90 tablet  1  . CRESTOR 10 MG tablet TAKE ONE TABLET BY MOUTH ONCE DAILY IN THE EVENING.  90 each  1  . fenofibrate 160 MG tablet TAKE ONE TABLET BY MOUTH ONE TIME DAILY  90 tablet  3  . furosemide (LASIX) 40 MG tablet Take 1 tablet (40 mg total) by mouth daily.  90 tablet  3  . glipiZIDE (GLUCOTROL) 10 MG tablet TAKE HALF TABLET BY MOUTH TWICE DAILY  90 tablet  1  . latanoprost (XALATAN) 0.005 % ophthalmic solution 1 drop at bedtime.        . metFORMIN (GLUCOPHAGE) 1000 MG tablet TAKE ONE TABLET BY MOUTH TWICE DAILY  180 tablet  2  . metoprolol (LOPRESSOR) 50 MG tablet TAKE ONE TABLET BY MOUTH TWICE DAILY  180 tablet  1  . MULTIPLE VITAMIN PO Take by mouth.        . potassium chloride (K-DUR) 10 MEQ tablet TAKE ONE TABLET BY MOUTH TWICE DAILY  180 tablet  1  . promethazine-codeine (PHENERGAN WITH CODEINE) 6.25-10 MG/5ML syrup Take 5 mLs by mouth every 4 (four) hours as needed.  120 mL  2  . rosuvastatin (CRESTOR) 10 MG tablet Take 1 tablet (10 mg total) by mouth at  bedtime.  30 tablet  11  . vitamin C (ASCORBIC ACID) 500 MG tablet Take 500 mg by mouth 2 (two) times daily.        Marland Kitchen warfarin (COUMADIN) 7.5 MG tablet TAKE ONE TABLET BY MOUTH ONE TIME DAILY  90 tablet  1  . zolpidem (AMBIEN) 10 MG tablet Take 10 mg by mouth at bedtime as needed.          Past Medical History  Diagnosis Date  . Back pain   . Hypertension   . Hyperlipidemia   . Diabetes mellitus type II   . CAD (coronary artery disease)   . Glaucoma   . Rosacea   . Adenomatous polyps     10/22/2003    Past Surgical History  Procedure Date  . Appendectomy   . Coronary artery bypass graft     bental procedure (AvR) : LIMA-LAD, SVG- Cx, RCA)  . Cholecystectomy     2003  . Nasal septum surgery   . Aortic valve replacement     St Jude's valve 2000    ROS:  As stated in the HPI and negative for all other systems.  PHYSICAL EXAM BP 159/75  Pulse 71  Ht 5\' 11"  (  1.803 m)  Wt 181 lb (82.101 kg)  BMI 25.24 kg/m2 GENERAL:  Well appearing HEENT:  Pupils equal round and reactive, fundi not visualized, oral mucosa unremarkable NECK:  No jugular venous distention, waveform within normal limits, carotid upstroke brisk and symmetric, no bruits, no thyromegaly LYMPHATICS:  No cervical, inguinal adenopathy LUNGS:  Clear to auscultation bilaterally BACK:  No CVA tenderness CHEST:  Well healed sternotomy scar. HEART:  PMI not displaced or sustained,S1 and S2 mechanical, no S3, no S4, no clicks, no rubs, slight systolic murmur heard at the apex, no diastolic murmurs ABD:  Flat, positive bowel sounds normal in frequency in pitch, no bruits, no rebound, no guarding, no midline pulsatile mass, no hepatomegaly, no splenomegaly EXT:  2 plus pulses throughout, no edema, no cyanosis no clubbing SKIN:  No rashes no nodules NEURO:  Cranial nerves II through XII grossly intact, motor grossly intact throughout [facility]:  Cognitively intact, oriented to person place and time  EKG:  Sinus rhythm, right  bundle branch block, left anterior fascicular block, rate 71 12/10/2011   ASSESSMENT AND PLAN

## 2011-12-10 NOTE — Assessment & Plan Note (Signed)
His blood pressure is elevated but it has not been on the last 3 readings. He will keep a record of his blood pressure at home and let me know if it is above target.

## 2011-12-10 NOTE — Assessment & Plan Note (Signed)
His hemoglobin A1c was 8.6 this summer. There was a mention of medication changes that are not sure whether the patient was aware of what the changes were to be. I will refer this back to Dr. Debby Bud.

## 2011-12-10 NOTE — Assessment & Plan Note (Signed)
I reviewed his labs recently. His triglycerides were elevated probably related to his elevated blood sugar. We discussed this but no change in therapy for dyslipidemia is indicated.

## 2011-12-10 NOTE — Patient Instructions (Signed)
Your physician has requested that you have an exercise tolerance test. For further information please visit [email or web address]. Please also follow instruction sheet, as given.  Your physician has requested that you have an echocardiogram. Echocardiography is a painless test that uses sound waves to create images of your heart. It provides your doctor with information about the size and shape of your heart and how well your heart's chambers and valves are working. This procedure takes approximately one hour. There are no restrictions for this procedure.  The current medical regimen is effective;  continue present plan and medications.  

## 2011-12-10 NOTE — Assessment & Plan Note (Signed)
I will bring the patient back for a POET (Plain Old Exercise Test). This will allow me to screen for obstructive coronary disease, risk stratify and very importantly provide a prescription for exercise.   

## 2011-12-10 NOTE — Assessment & Plan Note (Signed)
It has been 6 years since echocardiography. I will followup his aortic valve replacement with an echocardiogram. Of note we discussed the association of fever and endocarditis and he is aware to call me if he has any sustained fevers.

## 2011-12-11 ENCOUNTER — Telehealth: Payer: Self-pay | Admitting: *Deleted

## 2011-12-11 NOTE — Telephone Encounter (Signed)
Left message on patient VM, to call and schedule echo/gxt per Dr. Antoine Poche.

## 2011-12-13 ENCOUNTER — Telehealth: Payer: Self-pay | Admitting: *Deleted

## 2011-12-13 NOTE — Telephone Encounter (Signed)
Mr. Greenblatt lives In [place] and doesn't want to drive to [place] twice to have an echo/treadmill. Per Dr. Antoine Poche have patient come to the office only for echocardiogram cancel GXT.

## 2011-12-28 ENCOUNTER — Other Ambulatory Visit: Payer: Self-pay | Admitting: Internal Medicine

## 2011-12-31 ENCOUNTER — Encounter: Payer: Medicare Other | Admitting: Physician Assistant

## 2011-12-31 ENCOUNTER — Ambulatory Visit (HOSPITAL_COMMUNITY): Payer: Medicare Other | Attending: Cardiology

## 2011-12-31 ENCOUNTER — Other Ambulatory Visit: Payer: Self-pay

## 2011-12-31 DIAGNOSIS — I10 Essential (primary) hypertension: Secondary | ICD-10-CM | POA: Insufficient documentation

## 2011-12-31 DIAGNOSIS — E119 Type 2 diabetes mellitus without complications: Secondary | ICD-10-CM | POA: Insufficient documentation

## 2011-12-31 DIAGNOSIS — I2510 Atherosclerotic heart disease of native coronary artery without angina pectoris: Secondary | ICD-10-CM

## 2011-12-31 DIAGNOSIS — E785 Hyperlipidemia, unspecified: Secondary | ICD-10-CM | POA: Insufficient documentation

## 2011-12-31 DIAGNOSIS — I359 Nonrheumatic aortic valve disorder, unspecified: Secondary | ICD-10-CM

## 2012-01-07 ENCOUNTER — Ambulatory Visit (INDEPENDENT_AMBULATORY_CARE_PROVIDER_SITE_OTHER): Payer: Medicare Other | Admitting: Internal Medicine

## 2012-01-07 ENCOUNTER — Encounter: Payer: Self-pay | Admitting: Internal Medicine

## 2012-01-07 ENCOUNTER — Ambulatory Visit (INDEPENDENT_AMBULATORY_CARE_PROVIDER_SITE_OTHER)
Admission: RE | Admit: 2012-01-07 | Discharge: 2012-01-07 | Disposition: A | Discharge status: Home / Self Care | Payer: Medicare Other | Source: Ambulatory Visit | Attending: Internal Medicine | Admitting: Internal Medicine

## 2012-01-07 VITALS — BP 118/80 | HR 82 | Temp 97.6°F | Resp 16 | Wt 181.1 lb

## 2012-01-07 DIAGNOSIS — J209 Acute bronchitis, unspecified: Secondary | ICD-10-CM

## 2012-01-07 DIAGNOSIS — R05 Cough: Secondary | ICD-10-CM

## 2012-01-07 DIAGNOSIS — J189 Pneumonia, unspecified organism: Secondary | ICD-10-CM | POA: Insufficient documentation

## 2012-01-07 DIAGNOSIS — R059 Cough, unspecified: Secondary | ICD-10-CM | POA: Insufficient documentation

## 2012-01-07 MED ORDER — PROMETHAZINE-CODEINE 6.25-10 MG/5ML PO SYRP: 5.0000 mL | ORAL_SOLUTION | ORAL | Status: DC | PRN

## 2012-01-07 MED ORDER — AZITHROMYCIN 500 MG PO TABS: 500.0000 mg | ORAL_TABLET | Freq: Every day | ORAL | Status: AC

## 2012-01-07 NOTE — Assessment & Plan Note (Signed)
CXR is + for PNA so he will start antibiotics

## 2012-01-07 NOTE — Patient Instructions (Signed)

## 2012-01-07 NOTE — Assessment & Plan Note (Signed)
Start antibiotic and a cough suppressant

## 2012-01-07 NOTE — Assessment & Plan Note (Signed)
Stop the ACEI

## 2012-01-07 NOTE — Progress Notes (Signed)
  Subjective:    Patient ID: Troy Moore, male    DOB: [birth year], 66 y.o.   MRN: [id number]  Cough This is a recurrent problem. The current episode started more than 1 month ago. The problem has been gradually worsening. The problem occurs every few hours. The cough is productive of purulent sputum. Associated symptoms include chills, ear pain, a fever, a sore throat and sweats. Pertinent negatives include no chest pain, ear congestion, headaches, heartburn, hemoptysis, myalgias, nasal congestion, postnasal drip, rash, rhinorrhea, shortness of breath, weight loss or wheezing. He has tried nothing for the symptoms.      Review of Systems  Constitutional: Positive for fever and chills. Negative for weight loss, diaphoresis, activity change, appetite change, fatigue and unexpected weight change.  HENT: Positive for ear pain and sore throat. Negative for rhinorrhea and postnasal drip.   Eyes: Negative.   Respiratory: Positive for cough. Negative for apnea, hemoptysis, choking, chest tightness, shortness of breath, wheezing and stridor.   Cardiovascular: Negative for chest pain, palpitations and leg swelling.  Gastrointestinal: Negative for heartburn, nausea, vomiting, abdominal pain, diarrhea, constipation and anal bleeding.  Genitourinary: Negative.   Musculoskeletal: Negative for myalgias, back pain, joint swelling, arthralgias and gait problem.  Skin: Negative for color change, pallor, rash and wound.  Neurological: Negative for dizziness, tremors, seizures, syncope, facial asymmetry, speech difficulty, weakness, light-headedness, numbness and headaches.  Hematological: Negative for adenopathy. Does not bruise/bleed easily.  Psychiatric/Behavioral: Negative.        Objective:   Physical Exam  Vitals reviewed. Constitutional: He is oriented to person, place, and time. He appears well-developed and well-nourished. No distress.  HENT:  Head: Normocephalic and atraumatic.  Mouth/Throat:  Oropharynx is clear and moist. No oropharyngeal exudate.  Eyes: Conjunctivae are normal. Right eye exhibits no discharge. Left eye exhibits no discharge. No scleral icterus.  Neck: Normal range of motion. Neck supple. No JVD present. No tracheal deviation present. No thyromegaly present.  Cardiovascular: Normal rate, regular rhythm, S1 normal, S2 normal, intact distal pulses and normal pulses.  Exam reveals no gallop.   Murmur heard.  Systolic murmur is present with a grade of 1/6       + loud click  Pulmonary/Chest: Effort normal and breath sounds normal. No stridor. No respiratory distress. He has no wheezes. He has no rales. He exhibits no tenderness.  Abdominal: Soft. Bowel sounds are normal. He exhibits no distension and no mass. There is no tenderness. There is no rebound and no guarding.  Musculoskeletal: Normal range of motion. He exhibits no edema and no tenderness.  Lymphadenopathy:    He has no cervical adenopathy.  Neurological: He is oriented to person, place, and time.  Skin: Skin is warm and dry. No rash noted. He is not diaphoretic. No erythema. No pallor.  Psychiatric: He has a normal mood and affect. His behavior is normal. Judgment and thought content normal.          Assessment & Plan:

## 2012-01-10 ENCOUNTER — Telehealth: Payer: Self-pay | Admitting: Internal Medicine

## 2012-01-10 MED ORDER — CEFUROXIME AXETIL 250 MG PO TABS: 250.0000 mg | ORAL_TABLET | Freq: Two times a day (BID) | ORAL | Status: AC

## 2012-01-10 NOTE — Telephone Encounter (Signed)
STILL HAVING A COUGH AND CONGESTION IN HEAD AND CHEST.  X-RAY SHOWED PNEUMONIA.  HE LIVES IN [place].  SHOULD HE COME IN OR SHOULD HE HAVE MORE ANTIBIOTICS. HIS ANTIBIOTICS ENDED YESTERDAY. [facility] IS HIS PHARMACY:  [phone]

## 2012-01-10 NOTE — Telephone Encounter (Signed)
Chart reviewed, xray report reviewed. Patient with RLL PNA/CAP. He was started on azithromycin 500 mg qd x 3  He has azithromycin on board for 10 days. Will expand coverage by adding ceftin 250 mg bid for 7 days. Please call in to his pharmacy in [place].  He will need to be seen if he has high fevers, shortness of breath or increased work of breathing

## 2012-01-10 NOTE — Telephone Encounter (Signed)
Rx phoned in.  Patient informed.

## 2012-01-27 ENCOUNTER — Other Ambulatory Visit: Payer: Self-pay | Admitting: Internal Medicine

## 2012-02-14 ENCOUNTER — Other Ambulatory Visit: Payer: Self-pay | Admitting: Internal Medicine

## 2012-03-13 ENCOUNTER — Other Ambulatory Visit: Payer: Self-pay | Admitting: Internal Medicine

## 2012-03-27 ENCOUNTER — Other Ambulatory Visit: Payer: Self-pay | Admitting: Cardiology

## 2012-03-27 NOTE — Telephone Encounter (Signed)
..   Requested Prescriptions   Pending Prescriptions Disp Refills  . fenofibrate 160 MG tablet [Pharmacy Med Name: FENOFIBRATE 160 MG  TAB GLOB] 90 tablet 6    Sig: TAKE ONE TABLET BY MOUTH ONE TIME DAILY

## 2012-04-16 ENCOUNTER — Other Ambulatory Visit: Payer: Self-pay | Admitting: Internal Medicine

## 2012-04-27 ENCOUNTER — Other Ambulatory Visit: Payer: Self-pay | Admitting: Internal Medicine

## 2012-04-28 NOTE — Telephone Encounter (Signed)
[facility]

## 2012-05-14 ENCOUNTER — Other Ambulatory Visit: Payer: Self-pay | Admitting: Internal Medicine

## 2012-06-11 ENCOUNTER — Ambulatory Visit (INDEPENDENT_AMBULATORY_CARE_PROVIDER_SITE_OTHER): Payer: Medicare Other | Admitting: Internal Medicine

## 2012-06-11 ENCOUNTER — Encounter: Payer: Self-pay | Admitting: Internal Medicine

## 2012-06-11 ENCOUNTER — Other Ambulatory Visit (INDEPENDENT_AMBULATORY_CARE_PROVIDER_SITE_OTHER): Payer: Medicare Other

## 2012-06-11 VITALS — BP 128/60 | HR 63 | Temp 97.7°F | Resp 16 | Wt 181.0 lb

## 2012-06-11 DIAGNOSIS — E785 Hyperlipidemia, unspecified: Secondary | ICD-10-CM

## 2012-06-11 DIAGNOSIS — Z23 Encounter for immunization: Secondary | ICD-10-CM

## 2012-06-11 DIAGNOSIS — Z860100 Personal history of colon polyps, unspecified: Secondary | ICD-10-CM

## 2012-06-11 DIAGNOSIS — Z954 Presence of other heart-valve replacement: Secondary | ICD-10-CM

## 2012-06-11 DIAGNOSIS — E119 Type 2 diabetes mellitus without complications: Secondary | ICD-10-CM

## 2012-06-11 DIAGNOSIS — I2510 Atherosclerotic heart disease of native coronary artery without angina pectoris: Secondary | ICD-10-CM

## 2012-06-11 DIAGNOSIS — L719 Rosacea, unspecified: Secondary | ICD-10-CM

## 2012-06-11 DIAGNOSIS — R05 Cough: Secondary | ICD-10-CM

## 2012-06-11 DIAGNOSIS — R059 Cough, unspecified: Secondary | ICD-10-CM

## 2012-06-11 DIAGNOSIS — I10 Essential (primary) hypertension: Secondary | ICD-10-CM

## 2012-06-11 DIAGNOSIS — Z0000 Encounter for general adult medical examination without abnormal findings: Secondary | ICD-10-CM

## 2012-06-11 DIAGNOSIS — J209 Acute bronchitis, unspecified: Secondary | ICD-10-CM

## 2012-06-11 LAB — LDL CHOLESTEROL, DIRECT: Direct LDL: 54.7 mg/dL

## 2012-06-11 LAB — COMPREHENSIVE METABOLIC PANEL
ALT: 19 U/L (ref 0–53)
AST: 23 U/L (ref 0–37)
Albumin: 4.6 g/dL (ref 3.5–5.2)
Alkaline Phosphatase: 53 U/L (ref 39–117)
Potassium: 3.6 mEq/L (ref 3.5–5.1)
Sodium: 138 mEq/L (ref 135–145)
Total Bilirubin: 1 mg/dL (ref 0.3–1.2)
Total Protein: 7.5 g/dL (ref 6.0–8.3)

## 2012-06-11 LAB — HEPATIC FUNCTION PANEL
ALT: 19 U/L (ref 0–53)
AST: 23 U/L (ref 0–37)
Albumin: 4.6 g/dL (ref 3.5–5.2)
Alkaline Phosphatase: 53 U/L (ref 39–117)

## 2012-06-11 LAB — TSH: TSH: 2.12 u[IU]/mL (ref 0.35–5.50)

## 2012-06-11 LAB — LIPID PANEL
HDL: 38.3 mg/dL — ABNORMAL LOW (ref >39.00)
Total CHOL/HDL Ratio: 3
Triglycerides: 280 mg/dL — ABNORMAL HIGH (ref 0.0–149.0)
VLDL: 56 mg/dL — ABNORMAL HIGH (ref 0.0–40.0)

## 2012-06-11 LAB — HEMOGLOBIN A1C: Hgb A1c MFr Bld: 8.8 % — ABNORMAL HIGH (ref 4.6–6.5)

## 2012-06-11 MED ORDER — PROMETHAZINE-CODEINE 6.25-10 MG/5ML PO SYRP: 5.0000 mL | ORAL_SOLUTION | ORAL | Status: DC | PRN

## 2012-06-11 MED ORDER — PROMETHAZINE-CODEINE 6.25-10 MG/5ML PO SYRP: 5.0000 mL | ORAL_SOLUTION | ORAL | Status: AC | PRN

## 2012-06-11 NOTE — Assessment & Plan Note (Signed)
Last LDL 65.1. Due for follow-up lab with recommendations to follow.

## 2012-06-11 NOTE — Assessment & Plan Note (Addendum)
Lab Results  Component Value Date   HGBA1C 8.6* 06/04/2011   Due for lab today. Recommendations to follow. Goal if 7% or less  Addendum: A1C 8.8%  On 3 oral agents.  Plan - will need to consider basal insulin therapy for better control

## 2012-06-11 NOTE — Assessment & Plan Note (Signed)
Stable with no report of chest pain or other symptoms. He is current with Dr. Antoine Poche.  Plan-  Continue cardiac risk reduction strategies.

## 2012-06-11 NOTE — Assessment & Plan Note (Signed)
BP Readings from Last 3 Encounters:  06/11/12 128/60  01/07/12 118/80  12/10/11 159/75   Good control on present medications.

## 2012-06-11 NOTE — Assessment & Plan Note (Signed)
Due for follow up in 2015

## 2012-06-11 NOTE — Assessment & Plan Note (Signed)
Stable w/o outbreak.

## 2012-06-11 NOTE — Assessment & Plan Note (Signed)
Interval hx unremarkable. Physical exam is normal. Labs ordered and pending. He is current with colorectal cancer screening. Immunizations are brought up to date.  In summary - a very nice man with many medical problems currently stable. He is given a sample of viagra 100 mg, 1/2 tab prn. ROV 6 months with Dr. Antoine Poche, 12 months PC.

## 2012-06-11 NOTE — Progress Notes (Signed)
Subjective:    Patient ID: Troy Moore, male    DOB: 1945/12/12, 66 y.o.   MRN: [id number]  HPI The patient is here for annual Medicare wellness examination and management of other chronic and acute problems. He has been doing well: no major medical illness, no surgery, no injury in the interval. He did see Dr. Virgina Jock in February. He does c/o constipation which is a new issue. Recommend bulk fiber. He does have a minor right groin pain. May be a pain related to how he exercises.   The risk factors are reflected in the social history.  The roster of all physicians providing medical care to patient - is listed in the Snapshot section of the chart.  Activities of daily living:  The patient is 100% inedpendent in all ADLs: dressing, toileting, feeding as well as independent mobility  Home safety : The patient has smoke detectors in the home. They wear seatbelts. Flls - has been more prepared. firearms are present in the home, kept in a safe fashion. There is no violence in the home.   There is no risks for hepatitis, STDs or HIV. There is no   history of blood transfusion. They have no travel history to infectious disease endemic areas of the world.  The patient has seen their dentist in the last six month. They have seen their eye doctor in the last year. They deny any hearing difficulty and have not had audiologic testing in the last year.  They do not  have excessive sun exposure. Discussed the need for sun protection: hats, long sleeves and use of sunscreen if there is significant sun exposure.   Diet: the importance of a healthy diet is discussed. They do have a healthy (unhealthy-high fat/fast food) diet.  The patient has a regular exercise program: _gym,  45 duration, 3-4 days per week.  The benefits of regular aerobic exercise were discussed.  Depression screen: there are no signs or vegative symptoms of depression- irritability, change in appetite, anhedonia,  sadness/tearfullness.  Cognitive assessment: the patient manages all their financial and personal affairs and is actively engaged.  The following portions of the patient's history were reviewed and updated as appropriate: allergies, current medications, past family history, past medical history,  past surgical history, past social history  and problem list.  Vision, hearing, body mass index were assessed and reviewed.   During the course of the visit the patient was educated and counseled about appropriate screening and preventive services including : fall prevention , diabetes screening, nutrition counseling, colorectal cancer screening, and recommended immunizations.  Past Medical History  Diagnosis Date  . Back pain   . Hypertension   . Hyperlipidemia   . Diabetes mellitus type II   . CAD (coronary artery disease)   . Glaucoma   . Rosacea   . Adenomatous polyps     10/22/2003   Past Surgical History  Procedure Date  . Appendectomy   . Coronary artery bypass graft     bental procedure (AvR) : LIMA-LAD, SVG- Cx, RCA)  . Cholecystectomy     2003  . Nasal septum surgery   . Aortic valve replacement     St Jude's valve 2000   Family History  Problem Relation Age of Onset  . COPD Mother   . Cancer Maternal Uncle     prostate   History   Social History  . Marital Status: Married    Spouse Name: N/A    Number of Children: N/A  .  Years of Education: N/A   Occupational History  . Not on file.   Social History Main Topics  . Smoking status: Former Smoker    Quit date: 12/09/1976  . Smokeless tobacco: Not on file  . Alcohol Use: No  . Drug Use: No  . Sexually Active: Not Currently   Other Topics Concern  . Not on file   Social History Narrative   [employer]ried '68- 12 years divorced, long term relationship 61 years3 daughters-'70, '72, '75: 5 GrandchildrenWork: independent manPatient is a former smoker.  Stopped smoking over 25 years agoAlcohol use- noDaily  caffeine use 4 cups per dayIllicit Drug use- noPatient gets regular excercise    Current Outpatient Prescriptions on File Prior to Visit  Medication Sig Dispense Refill  . CRESTOR 10 MG tablet TAKE ONE TABLET BY MOUTH ONE TIME DAILY IN THE EVENING  90 each  1  . fenofibrate 160 MG tablet TAKE ONE TABLET BY MOUTH ONE TIME DAILY  90 tablet  6  . furosemide (LASIX) 40 MG tablet TAKE ONE TABLET BY MOUTH ONE TIME DAILY  90 tablet  1  . glipiZIDE (GLUCOTROL) 10 MG tablet TAKE HALF TABLET BY MOUTH TWICE DAILY  90 tablet  3  . latanoprost (XALATAN) 0.005 % ophthalmic solution 1 drop at bedtime.        . metFORMIN (GLUCOPHAGE) 1000 MG tablet TAKE ONE TABLET BY MOUTH TWICE DAILY  180 tablet  3  . metoprolol (LOPRESSOR) 50 MG tablet TAKE ONE TABLET BY MOUTH TWICE DAILY  180 tablet  0  . MULTIPLE VITAMIN PO Take by mouth.        . potassium chloride (K-DUR) 10 MEQ tablet TAKE ONE TABLET BY MOUTH TWICE DAILY  180 tablet  0  . promethazine-codeine (PHENERGAN WITH CODEINE) 6.25-10 MG/5ML syrup Take 5 mLs by mouth every 4 (four) hours as needed.  240 mL  0  . vitamin C (ASCORBIC ACID) 500 MG tablet Take 500 mg by mouth 2 (two) times daily.        Marland Kitchen warfarin (COUMADIN) 7.5 MG tablet TAKE ONE TABLET BY MOUTH ONE TIME DAILY  90 tablet  3  . zolpidem (AMBIEN) 10 MG tablet Take 10 mg by mouth at bedtime as needed.        Marland Kitchen amLODipine (NORVASC) 10 MG tablet TAKE ONE TABLET BY MOUTH ONE TIME DAILY  90 tablet  4  . rosuvastatin (CRESTOR) 10 MG tablet Take 1 tablet (10 mg total) by mouth at bedtime.  30 tablet  11          Review of Systems Constitutional:  Negative for fever, chills, activity change and unexpected weight change.  HEENT:  Negative for hearing loss, ear pain, congestion, neck stiffness and postnasal drip. Negative for sore throat or swallowing problems. Negative for dental complaints.   Eyes: Negative for vision loss or change in visual acuity.  Respiratory: Negative for chest tightness and  wheezing. Negative for DOE.   Cardiovascular: Negative for chest pain or palpitations. No decreased exercise tolerance Gastrointestinal: No change in bowel habit. No bloating or gas. more reflux or indigestion relieved with tums. Had an adenomatous polyp in ;'10 Genitourinary: Negative for urgency, frequency, flank pain and difficulty urinating.  Musculoskeletal: Negative for myalgias, back pain, arthralgias and gait problem.  Neurological: Negative for dizziness, tremors, weakness and headaches.  Hematological: Negative for adenopathy.  Psychiatric/Behavioral: Negative for behavioral problems and dysphoric mood.       Objective:   Physical Exam Filed  Vitals:   06/11/12 1341  BP: 128/60  Pulse: 63  Temp: 97.7 F (36.5 C)  Resp: 16   Wt Readings from Last 3 Encounters:  06/11/12 181 lb (82.101 kg)  01/07/12 181 lb 2 oz (82.158 kg)  12/10/11 181 lb (82.101 kg)   Gen'l: Well nourished well developed white male in no acute distress  HEENT: Head: Normocephalic and atraumatic. Right Ear: External ear normal. EAC/TM nl. Left Ear: External ear normal.  EAC/TM nl. Nose: Nose normal. Mouth/Throat: Oropharynx is clear and moist. Dentition - native, in good repair. No buccal or palatal lesions. Posterior pharynx clear. Eyes: Conjunctivae and sclera clear. EOM intact. Pupils are equal, round, and reactive to light. Right eye exhibits no discharge. Left eye exhibits no discharge. Neck: Normal range of motion. Neck supple. No JVD present. No tracheal deviation present. No thyromegaly present.  Cardiovascular: Normal rate, regular rhythm, no gallop, no friction rub. III/VI mechanical valve murmur best at RSB      Quiet precordium. 2+ radial and DP pulses . No carotid bruits Pulmonary/Chest: Effort normal. No respiratory distress or increased WOB, no wheezes, no rales. No chest wall deformity or CVAT. Well healed sternotomy scar. Abdomen: Soft. Bowel sounds are normal in all quadrants. He exhibits no  distension, no tenderness, no rebound or guarding, No heptosplenomegaly  Genitourinary:  Deferred Musculoskeletal: Normal range of motion. He exhibits no edema and no tenderness.       Small and large joints without redness, synovial thickening or deformity. Full range of motion preserved about all small, median and large joints. Bony protuberance top of tibia bilaterally. Lymphadenopathy:    He has no cervical or supraclavicular adenopathy.  Neurological: He is alert and oriented to person, place, and time. CN II-XII intact. DTRs 2+ and symmetrical biceps, radial and patellar tendons. Cerebellar function normal with no tremor, rigidity, normal gait and station.  Skin: Skin is warm and dry. No rash noted. No erythema.  Psychiatric: He has a normal mood and affect. His behavior is normal. Thought content normal.   Lab Results  Component Value Date   WBC 4.7 06/04/2011   HGB 13.5 06/04/2011   HCT 39.5 06/04/2011   PLT 171.0 06/04/2011   GLUCOSE 269* 06/11/2012   CHOL 123 06/11/2012   TRIG 280.0* 06/11/2012   HDL 38.30* 06/11/2012   LDLDIRECT 54.7 06/11/2012   LDLCALC 58 08/06/2008   ALT 19 06/11/2012   ALT 19 06/11/2012   AST 23 06/11/2012   AST 23 06/11/2012   NA 138 06/11/2012   K 3.6 06/11/2012   CL 101 06/11/2012   CREATININE 1.2 06/11/2012   BUN 14 06/11/2012   CO2 30 06/11/2012   TSH 2.12 06/11/2012   PSA 0.50 06/04/2011   INR 4.4* 06/11/2012   HGBA1C 8.8* 06/11/2012          Assessment & Plan:

## 2012-06-12 ENCOUNTER — Other Ambulatory Visit: Payer: Self-pay | Admitting: Internal Medicine

## 2012-06-15 ENCOUNTER — Other Ambulatory Visit: Payer: Self-pay | Admitting: Internal Medicine

## 2012-06-15 ENCOUNTER — Encounter: Payer: Self-pay | Admitting: Internal Medicine

## 2012-06-15 DIAGNOSIS — E119 Type 2 diabetes mellitus without complications: Secondary | ICD-10-CM

## 2012-06-15 MED ORDER — SAXAGLIPTIN HCL 5 MG PO TABS: 5.0000 mg | ORAL_TABLET | Freq: Every day | ORAL | Status: DC

## 2012-06-27 ENCOUNTER — Other Ambulatory Visit: Payer: Self-pay | Admitting: Internal Medicine

## 2012-07-29 ENCOUNTER — Encounter: Payer: Self-pay | Admitting: Internal Medicine

## 2012-07-29 ENCOUNTER — Ambulatory Visit (INDEPENDENT_AMBULATORY_CARE_PROVIDER_SITE_OTHER): Payer: Medicare Other | Admitting: Internal Medicine

## 2012-07-29 VITALS — BP 128/64 | HR 66 | Temp 98.1°F | Resp 16 | Wt 181.0 lb

## 2012-07-29 DIAGNOSIS — Z23 Encounter for immunization: Secondary | ICD-10-CM

## 2012-07-29 DIAGNOSIS — E119 Type 2 diabetes mellitus without complications: Secondary | ICD-10-CM

## 2012-07-29 NOTE — Assessment & Plan Note (Signed)
Patient has redoubled his efforts regarding - diet and exercise.   Plan Continue triple drug therapy and life-style  Repeat lab December '13.

## 2012-07-29 NOTE — Patient Instructions (Addendum)
Good job on improving your diet qnd exercise. Will be checking on the A1C December '13. If controlled we're done, if not controlled (A1C 7% or less) will be moving to basal insulin therapy.  All your other labs are great. Last 5 years of data on PSA is perfect - no risk of prostate cancer at this time.

## 2012-07-30 NOTE — Progress Notes (Signed)
Subjective:    Patient ID: Troy Moore, male    DOB: 04-Dec-1945, 66 y.o.   MRN: [id number]  HPI Troy Moore presents to discuss treatment options for elevated A1C. Since he received the report of AiC 8.8% he has been adherent with medications including addition of Onglyza; he has reduced carbs in his diet; he has increased his physical exercise. This has resulted in some modest weight loss and he reports that he feels fine.  Reviewed his record: he has had normal PSA levels for the last 5 years. Explained recent ACU guidelines and that he does not need repeat PSA and he is about to age out of the need for screening.  Past Medical History  Diagnosis Date  . Back pain   . Hypertension   . Hyperlipidemia   . Diabetes mellitus type II   . CAD (coronary artery disease)   . Glaucoma(365)   . Rosacea   . Adenomatous polyps     10/22/2003   Past Surgical History  Procedure Date  . Appendectomy   . Coronary artery bypass graft     bental procedure (AvR) : LIMA-LAD, SVG- Cx, RCA)  . Cholecystectomy     2003  . Nasal septum surgery   . Aortic valve replacement     St Jude's valve 2000   Family History  Problem Relation Age of Onset  . COPD Mother   . Cancer Maternal Uncle     prostate   History   Social History  . Marital Status: Married    Spouse Name: N/A    Number of Children: N/A  . Years of Education: N/A   Occupational History  . Not on file.   Social History Main Topics  . Smoking status: Former Smoker    Quit date: 12/09/1976  . Smokeless tobacco: Not on file  . Alcohol Use: No  . Drug Use: No  . Sexually Active: Not Currently   Other Topics Concern  . Not on file   Social History Narrative   [employer]ried '68- 12 years divorced, long term relationship 46 years3 daughters-'70, '72, '75: 5 GrandchildrenWork: independent manPatient is a former smoker.  Stopped smoking over 25 years agoAlcohol use- noDaily caffeine use 4 cups per dayIllicit Drug use-  noPatient gets regular excercise    Current Outpatient Prescriptions on File Prior to Visit  Medication Sig Dispense Refill  . amLODipine (NORVASC) 10 MG tablet TAKE ONE TABLET BY MOUTH ONE TIME DAILY  90 tablet  3  . CRESTOR 10 MG tablet TAKE ONE TABLET BY MOUTH ONE TIME DAILY IN THE EVENING  90 each  1  . fenofibrate 160 MG tablet TAKE ONE TABLET BY MOUTH ONE TIME DAILY  90 tablet  6  . furosemide (LASIX) 40 MG tablet TAKE ONE TABLET BY MOUTH ONE TIME DAILY  90 tablet  1  . glipiZIDE (GLUCOTROL) 10 MG tablet TAKE HALF TABLET BY MOUTH TWICE DAILY  90 tablet  3  . latanoprost (XALATAN) 0.005 % ophthalmic solution 1 drop at bedtime.        . metFORMIN (GLUCOPHAGE) 1000 MG tablet TAKE ONE TABLET BY MOUTH TWICE DAILY  180 tablet  3  . metoprolol (LOPRESSOR) 50 MG tablet TAKE ONE TABLET BY MOUTH TWICE DAILY  180 tablet  0  . MULTIPLE VITAMIN PO Take by mouth.        . potassium chloride (K-DUR) 10 MEQ tablet TAKE ONE TABLET BY MOUTH TWICE DAILY  180 tablet  1  .  promethazine-codeine (PHENERGAN WITH CODEINE) 6.25-10 MG/5ML syrup Take 5 mLs by mouth every 4 (four) hours as needed.  240 mL  2  . saxagliptin HCl (ONGLYZA) 5 MG TABS tablet Take 1 tablet (5 mg total) by mouth daily.  30 tablet  11  . vitamin C (ASCORBIC ACID) 500 MG tablet Take 500 mg by mouth 2 (two) times daily.        Marland Kitchen warfarin (COUMADIN) 7.5 MG tablet TAKE ONE TABLET BY MOUTH ONE TIME DAILY  90 tablet  3  . zolpidem (AMBIEN) 10 MG tablet Take 10 mg by mouth at bedtime as needed.        . rosuvastatin (CRESTOR) 10 MG tablet Take 1 tablet (10 mg total) by mouth at bedtime.  30 tablet  11      Review of Systems System review is negative for any constitutional, cardiac, pulmonary, GI or neuro symptoms or complaints other than as described in the HPI.     Objective:   Physical Exam Filed Vitals:   07/29/12 1420  BP: 128/64  Pulse: 66  Temp: 98.1 F (36.7 C)  Resp: 16   Wt Readings from Last 3 Encounters:  07/29/12 181  lb (82.101 kg)  06/11/12 181 lb (82.101 kg)  01/07/12 181 lb 2 oz (82.158 kg)   Gen'l - WNWD white man in no distress HEENT- normal Cor - RRR Pulm - normal respirations. Neuro - A&O x 3, nl strength, nl gait       Assessment & Plan:

## 2012-08-06 ENCOUNTER — Telehealth: Payer: Self-pay | Admitting: Internal Medicine

## 2012-08-06 MED ORDER — AZITHROMYCIN 250 MG PO TABS: ORAL_TABLET | ORAL | Status: DC

## 2012-08-06 NOTE — Telephone Encounter (Signed)
Caller: [name]; Patient Name: Troy Moore; PCP: Illene Regulus (Adults only); Best Callback Phone Number: [phone]; Call regarding: Upper Respiratory Infection; onset 08/06/12; hoarse; raw throat, but not hurting; feels hot, but afebrile; congested with a lot of clear nasal drainage; All emergent sxs of Upper Respiratory Infection protocol r/o; home care advice given; Giulio says he has a history of these sxs several times a yr and he knows they only get worse; last appt 10/19; wonders if Dr.Norins will call in a rx since he lives so far away

## 2012-08-06 NOTE — Telephone Encounter (Signed)
Reviewed symptoms, called patient. eScribed z-pak

## 2012-09-16 ENCOUNTER — Other Ambulatory Visit: Payer: Self-pay | Admitting: Internal Medicine

## 2012-09-17 ENCOUNTER — Other Ambulatory Visit: Payer: Self-pay | Admitting: *Deleted

## 2012-09-17 MED ORDER — METOPROLOL TARTRATE 50 MG PO TABS: 50.0000 mg | ORAL_TABLET | Freq: Two times a day (BID) | ORAL | Status: DC

## 2012-12-09 ENCOUNTER — Other Ambulatory Visit: Payer: Self-pay | Admitting: Internal Medicine

## 2012-12-12 ENCOUNTER — Other Ambulatory Visit: Payer: Self-pay | Admitting: Internal Medicine

## 2013-01-07 ENCOUNTER — Other Ambulatory Visit: Payer: Self-pay | Admitting: Internal Medicine

## 2013-01-08 ENCOUNTER — Ambulatory Visit (INDEPENDENT_AMBULATORY_CARE_PROVIDER_SITE_OTHER): Payer: Medicare Other | Admitting: Cardiology

## 2013-01-08 ENCOUNTER — Encounter: Payer: Self-pay | Admitting: Cardiology

## 2013-01-08 VITALS — BP 160/70 | HR 65 | Ht 71.0 in | Wt 181.4 lb

## 2013-01-08 DIAGNOSIS — I10 Essential (primary) hypertension: Secondary | ICD-10-CM

## 2013-01-08 DIAGNOSIS — E785 Hyperlipidemia, unspecified: Secondary | ICD-10-CM

## 2013-01-08 DIAGNOSIS — I2510 Atherosclerotic heart disease of native coronary artery without angina pectoris: Secondary | ICD-10-CM

## 2013-01-08 NOTE — Patient Instructions (Addendum)
**Note De-identified Troy Moore Obfuscation** Your physician recommends that you continue on your current medications as directed. Please refer to the Current Medication list given to you today.  Your physician wants you to follow-up in: 1 year. You will receive a reminder letter in the mail two months in advance. If you don't receive a letter, please call our office to schedule the follow-up appointment.  

## 2013-01-08 NOTE — Progress Notes (Signed)
HPI The patient presents for yearly followup. Since I last saw him he has done well. He has had no new cardiovascular complaints. He remains active exercising routinely with a aerobic exercise at the gym. With this he denies any chest pressure, neck or arm discomfort. He denies any palpitations, presyncope or syncope. He has no shortness of breath, PND or orthopnea. He has had no weight gain or edema.   Allergies  Allergen Reactions  . Benazepril     cough  . Niacin     Current Outpatient Prescriptions  Medication Sig Dispense Refill  . amLODipine (NORVASC) 10 MG tablet TAKE ONE TABLET BY MOUTH ONE TIME DAILY  90 tablet  3  . brimonidine (ALPHAGAN) 0.2 % ophthalmic solution Place 1 drop into both eyes 2 (two) times daily.      . CRESTOR 10 MG tablet TAKE ONE TABLET BY MOUTH EVERY EVENING  90 tablet  2  . fenofibrate 160 MG tablet TAKE ONE TABLET BY MOUTH ONE TIME DAILY  90 tablet  6  . furosemide (LASIX) 40 MG tablet TAKE ONE TABLET BY MOUTH ONE TIME DAILY  90 tablet  0  . glipiZIDE (GLUCOTROL) 10 MG tablet TAKE HALF TABLET BY MOUTH TWICE DAILY  90 tablet  3  . latanoprost (XALATAN) 0.005 % ophthalmic solution 1 drop at bedtime.        . metFORMIN (GLUCOPHAGE) 1000 MG tablet TAKE ONE TABLET BY MOUTH TWICE DAILY  180 tablet  3  . metoprolol (LOPRESSOR) 50 MG tablet TAKE ONE TABLET BY MOUTH TWICE DAILY  180 tablet  1  . MULTIPLE VITAMIN PO Take by mouth.        . potassium chloride (K-DUR) 10 MEQ tablet TAKE ONE TABLET BY MOUTH TWICE DAILY  180 tablet  1  . promethazine-codeine (PHENERGAN WITH CODEINE) 6.25-10 MG/5ML syrup Take 5 mLs by mouth every 4 (four) hours as needed.  240 mL  2  . saxagliptin HCl (ONGLYZA) 5 MG TABS tablet Take 1 tablet (5 mg total) by mouth daily.  30 tablet  11  . vitamin C (ASCORBIC ACID) 500 MG tablet Take 500 mg by mouth 2 (two) times daily.        Marland Kitchen warfarin (COUMADIN) 7.5 MG tablet TAKE ONE TABLET BY MOUTH ONE TIME DAILY  90 tablet  3  . zolpidem (AMBIEN)  10 MG tablet Take 10 mg by mouth at bedtime as needed.         No current facility-administered medications for this visit.    Past Medical History  Diagnosis Date  . Back pain   . Hypertension   . Hyperlipidemia   . Diabetes mellitus type II   . CAD (coronary artery disease)   . Glaucoma(365)   . Rosacea   . Adenomatous polyps     10/22/2003    Past Surgical History  Procedure Laterality Date  . Appendectomy    . Coronary artery bypass graft      bental procedure (AvR) : LIMA-LAD, SVG- Cx, RCA)  . Cholecystectomy      2003  . Nasal septum surgery    . Aortic valve replacement      St Jude's valve 2000    ROS:  As stated in the HPI and negative for all other systems.  PHYSICAL EXAM BP 160/70  Pulse 65  Ht 5\' 11"  (1.803 m)  Wt 181 lb 6.4 oz (82.283 kg)  BMI 25.31 kg/m2 GENERAL:  Well appearing HEENT:  Pupils equal round  and reactive, fundi not visualized, oral mucosa unremarkable NECK:  No jugular venous distention, waveform within normal limits, carotid upstroke brisk and symmetric, no bruits, no thyromegaly LYMPHATICS:  No cervical, inguinal adenopathy LUNGS:  Clear to auscultation bilaterally BACK:  No CVA tenderness CHEST:  Well healed sternotomy scar. HEART:  PMI not displaced or sustained,S1 and S2 mechanical, no S3, no S4, no clicks, no rubs, slight systolic murmur heard at the apex, no diastolic murmurs ABD:  Flat, positive bowel sounds normal in frequency in pitch, no bruits, no rebound, no guarding, no midline pulsatile mass, no hepatomegaly, no splenomegaly EXT:  2 plus pulses throughout, no edema, no cyanosis no clubbing SKIN:  No rashes no nodules NEURO:  Cranial nerves II through XII grossly intact, motor grossly intact throughout [facility]:  Cognitively intact, oriented to person place and time  EKG:  Sinus rhythm, right bundle branch block, rate 59 left anterior fascicular block, 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 No further testing is indicated. He follows his own INR is and these are therapeutic.  CAD:  The patient has no new sypmtoms.  No further cardiovascular testing is indicated.  We will continue with aggressive risk reduction and meds as listed.

## 2013-03-18 ENCOUNTER — Telehealth: Payer: Self-pay | Admitting: *Deleted

## 2013-03-18 NOTE — Telephone Encounter (Signed)
Left msg on triage requesting therapy change fax md order on Treyvonne Tata. Called [facility] back because they didn't leave DOB spoke with another rep verify which Dorina Hoyer. Per chart dont see order from 03/13/13 gave fax # will fax again...lmb

## 2013-03-20 ENCOUNTER — Telehealth: Payer: Self-pay

## 2013-03-20 DIAGNOSIS — E785 Hyperlipidemia, unspecified: Secondary | ICD-10-CM

## 2013-03-20 DIAGNOSIS — E119 Type 2 diabetes mellitus without complications: Secondary | ICD-10-CM

## 2013-03-20 DIAGNOSIS — I10 Essential (primary) hypertension: Secondary | ICD-10-CM

## 2013-03-20 NOTE — Telephone Encounter (Signed)
Phone call from T D, [facility] pharmacist, [phone] ext [phone]. He states due to patient's hypertension it is recommended he is on and ACE inhibitor or ARB. T D would like to know have you considered and would it be okay for the patient? Please advise, thanks.

## 2013-03-20 NOTE — Telephone Encounter (Signed)
I called and spoke to patient and he said he will come in, in about 7-8 days to have labs done and then will schedule an appt with Dr Debby Bud

## 2013-03-20 NOTE — Telephone Encounter (Signed)
IF TD doesn't want to see the patient in consultation I recommend: 1. Urine for microalbuminuria (order entered) 2. OV to discuss results and any appropriate change in medication. Last CPX Aug '13 - OK for Appointment at any time due to multiple chronic disease issues.  3. Will also order A1C, Cmet, lipid panel. LFTs

## 2013-03-23 ENCOUNTER — Other Ambulatory Visit: Payer: Self-pay

## 2013-03-23 MED ORDER — FUROSEMIDE 40 MG PO TABS: 40.0000 mg | ORAL_TABLET | Freq: Every day | ORAL | Status: DC

## 2013-05-13 ENCOUNTER — Other Ambulatory Visit: Payer: Self-pay | Admitting: Internal Medicine

## 2013-05-16 ENCOUNTER — Other Ambulatory Visit: Payer: Self-pay | Admitting: Internal Medicine

## 2013-05-20 ENCOUNTER — Other Ambulatory Visit (INDEPENDENT_AMBULATORY_CARE_PROVIDER_SITE_OTHER): Payer: Medicare Other

## 2013-05-20 DIAGNOSIS — I10 Essential (primary) hypertension: Secondary | ICD-10-CM

## 2013-05-20 DIAGNOSIS — E785 Hyperlipidemia, unspecified: Secondary | ICD-10-CM

## 2013-05-20 DIAGNOSIS — E119 Type 2 diabetes mellitus without complications: Secondary | ICD-10-CM

## 2013-05-20 LAB — HEPATIC FUNCTION PANEL
AST: 26 U/L (ref 0–37)
Albumin: 4.4 g/dL (ref 3.5–5.2)
Alkaline Phosphatase: 43 U/L (ref 39–117)

## 2013-05-20 LAB — COMPREHENSIVE METABOLIC PANEL
AST: 26 U/L (ref 0–37)
BUN: 17 mg/dL (ref 6–23)
CO2: 29 mEq/L (ref 19–32)
Calcium: 9.5 mg/dL (ref 8.4–10.5)
Chloride: 104 mEq/L (ref 96–112)
Creatinine, Ser: 1.3 mg/dL (ref 0.4–1.5)
GFR: 57.17 mL/min — ABNORMAL LOW (ref >60.00)

## 2013-05-20 LAB — LIPID PANEL
Total CHOL/HDL Ratio: 4
Triglycerides: 190 mg/dL — ABNORMAL HIGH (ref 0.0–149.0)

## 2013-05-22 ENCOUNTER — Encounter: Payer: Self-pay | Admitting: Internal Medicine

## 2013-06-19 ENCOUNTER — Other Ambulatory Visit: Payer: Self-pay | Admitting: Cardiology

## 2013-06-19 ENCOUNTER — Other Ambulatory Visit: Payer: Self-pay | Admitting: Internal Medicine

## 2013-06-22 ENCOUNTER — Other Ambulatory Visit: Payer: Self-pay | Admitting: Internal Medicine

## 2013-06-26 ENCOUNTER — Other Ambulatory Visit: Payer: Self-pay | Admitting: Internal Medicine

## 2013-06-29 ENCOUNTER — Telehealth: Payer: Self-pay | Admitting: Internal Medicine

## 2013-06-29 MED ORDER — POTASSIUM CHLORIDE ER 10 MEQ PO TBCR: EXTENDED_RELEASE_TABLET | ORAL | Status: DC

## 2013-06-29 NOTE — Telephone Encounter (Signed)
Patient is requesting a refill on Potassium chloride be sent to the [facility] in [facility]

## 2013-07-02 ENCOUNTER — Other Ambulatory Visit: Payer: Self-pay

## 2013-07-02 MED ORDER — METOPROLOL TARTRATE 50 MG PO TABS: 50.0000 mg | ORAL_TABLET | Freq: Two times a day (BID) | ORAL | Status: DC

## 2013-07-13 ENCOUNTER — Encounter: Payer: Self-pay | Admitting: Internal Medicine

## 2013-07-13 ENCOUNTER — Ambulatory Visit (INDEPENDENT_AMBULATORY_CARE_PROVIDER_SITE_OTHER): Payer: Medicare Other | Admitting: Internal Medicine

## 2013-07-13 VITALS — BP 152/66 | HR 65 | Temp 97.3°F | Wt 180.0 lb

## 2013-07-13 DIAGNOSIS — Z954 Presence of other heart-valve replacement: Secondary | ICD-10-CM

## 2013-07-13 DIAGNOSIS — I10 Essential (primary) hypertension: Secondary | ICD-10-CM

## 2013-07-13 DIAGNOSIS — E785 Hyperlipidemia, unspecified: Secondary | ICD-10-CM

## 2013-07-13 DIAGNOSIS — Z125 Encounter for screening for malignant neoplasm of prostate: Secondary | ICD-10-CM

## 2013-07-13 DIAGNOSIS — R059 Cough, unspecified: Secondary | ICD-10-CM

## 2013-07-13 DIAGNOSIS — Z23 Encounter for immunization: Secondary | ICD-10-CM

## 2013-07-13 DIAGNOSIS — E119 Type 2 diabetes mellitus without complications: Secondary | ICD-10-CM

## 2013-07-13 DIAGNOSIS — J209 Acute bronchitis, unspecified: Secondary | ICD-10-CM

## 2013-07-13 DIAGNOSIS — R05 Cough: Secondary | ICD-10-CM

## 2013-07-13 MED ORDER — PROMETHAZINE-CODEINE 6.25-10 MG/5ML PO SYRP: 5.0000 mL | ORAL_SOLUTION | ORAL | Status: DC | PRN

## 2013-07-13 NOTE — Patient Instructions (Addendum)
Thanks for coming in - love those Board of PHarmacy rules  You look like you are doing well. The lab August 6 was normal and the A1C was 7.5% - OK control.  Immunization - will give Prevnar pneumonia vaccine today and Flu shot.  Return in January '15 for lab, including PSA and Protime.   Have a good holiday season.

## 2013-07-13 NOTE — Progress Notes (Signed)
Subjective:    Patient ID: Troy Moore, male    DOB: 1945-11-22, 67 y.o.   MRN: [id number]  HPI Doing well. He has come in due to renewal of meds with message to see Dr. He did have labs in August that revealed good control of Cholesterol and Diabetes. He is feeling well. He is interested in having PSA rechecked but agrees to wait until Jan '15 when he is due for other lab.   PMH, FamHx and SocHx reviewed for any changes and relevance.  Current Outpatient Prescriptions on File Prior to Visit  Medication Sig Dispense Refill  . amLODipine (NORVASC) 10 MG tablet TAKE ONE TABLET BY MOUTH ONE TIME DAILY  90 tablet  3  . brimonidine (ALPHAGAN) 0.2 % ophthalmic solution Place 1 drop into both eyes 2 (two) times daily.      . CRESTOR 10 MG tablet Take one tablet by mouth every evening  90 tablet  0  . fenofibrate 160 MG tablet Take one tablet by mouth one time daily  90 tablet  4  . furosemide (LASIX) 40 MG tablet Take 1 tablet (40 mg total) by mouth daily.  90 tablet  0  . glipiZIDE (GLUCOTROL) 10 MG tablet Take half tablet by mouth twice daily  90 tablet  0  . latanoprost (XALATAN) 0.005 % ophthalmic solution 1 drop at bedtime.        . metFORMIN (GLUCOPHAGE) 1000 MG tablet Take one tablet by mouth twice daily  180 tablet  2  . metoprolol (LOPRESSOR) 50 MG tablet Take 1 tablet (50 mg total) by mouth 2 (two) times daily.  180 tablet  1  . MULTIPLE VITAMIN PO Take by mouth.        . ONGLYZA 5 MG TABS tablet Take one tablet by mouth one time daily  30 tablet  5  . potassium chloride (K-DUR) 10 MEQ tablet TAKE ONE TABLET BY MOUTH TWICE DAILY  180 tablet  1  . vitamin C (ASCORBIC ACID) 500 MG tablet Take 500 mg by mouth 2 (two) times daily.        Marland Kitchen warfarin (COUMADIN) 7.5 MG tablet Take one tablet by mouth one time daily  90 tablet  2  . zolpidem (AMBIEN) 10 MG tablet Take 10 mg by mouth at bedtime as needed.         No current facility-administered medications on file prior to visit.       Review of Systems System review is negative for any constitutional, cardiac, pulmonary, GI or neuro symptoms or complaints other than as described in the HPI.     Objective:   Physical Exam Filed Vitals:   07/13/13 1512  BP: 152/66  Pulse: 65  Temp: 97.3 F (36.3 C)   Gen'l - NWWD white man HEENT - C&S clear Cor RRR, sharp mechanical valve sound, no diastolic mm Lung - CTAP Neuro - A&O x 3, see DM foot exam  Recent Results (from the past 2160 hour(s))  LIPID PANEL     Status: Abnormal   Collection Time    05/20/13 12:25 PM      Result Value Range   Cholesterol 123  0 - 200 mg/dL   Comment: ATP III Classification       Desirable:  < 200 mg/dL               Borderline High:  200 - 239 mg/dL          High:  > =  240 mg/dL   Triglycerides 161.0 (*) 0.0 - 149.0 mg/dL   Comment: Normal:  <960 mg/dLBorderline High:  150 - 199 mg/dL   HDL 45.40 (*) >98.11 mg/dL   VLDL 91.4  0.0 - 78.2 mg/dL   LDL Cholesterol 51  0 - 99 mg/dL   Total CHOL/HDL Ratio 4     Comment:                Men          Women1/2 Average Risk     3.4          3.3Average Risk          5.0          4.42X Average Risk          9.6          7.13X Average Risk          15.0          11.0                      HEPATIC FUNCTION PANEL     Status: None   Collection Time    05/20/13 12:25 PM      Result Value Range   Total Bilirubin 0.8  0.3 - 1.2 mg/dL   Bilirubin, Direct 0.2  0.0 - 0.3 mg/dL   Alkaline Phosphatase 43  39 - 117 U/L   AST 26  0 - 37 U/L   ALT 21  0 - 53 U/L   Total Protein 6.8  6.0 - 8.3 g/dL   Albumin 4.4  3.5 - 5.2 g/dL  HEMOGLOBIN N5A     Status: Abnormal   Collection Time    05/20/13 12:25 PM      Result Value Range   Hemoglobin A1C 7.5 (*) 4.6 - 6.5 %   Comment: Glycemic Control Guidelines for People with Diabetes:Non Diabetic:  <6%Goal of Therapy: <7%Additional Action Suggested:  >8%   COMPREHENSIVE METABOLIC PANEL     Status: Abnormal   Collection Time    05/20/13 12:25 PM       Result Value Range   Sodium 140  135 - 145 mEq/L   Potassium 4.2  3.5 - 5.1 mEq/L   Chloride 104  96 - 112 mEq/L   CO2 29  19 - 32 mEq/L   Glucose, Bld 196 (*) 70 - 99 mg/dL   BUN 17  6 - 23 mg/dL   Creatinine, Ser 1.3  0.4 - 1.5 mg/dL   Total Bilirubin 0.8  0.3 - 1.2 mg/dL   Alkaline Phosphatase 43  39 - 117 U/L   AST 26  0 - 37 U/L   ALT 21  0 - 53 U/L   Total Protein 6.8  6.0 - 8.3 g/dL   Albumin 4.4  3.5 - 5.2 g/dL   Calcium 9.5  8.4 - 21.3 mg/dL   GFR 08.65 (*) >78.46 mL/min        Assessment & Plan:

## 2013-07-14 NOTE — Assessment & Plan Note (Signed)
Lab Results  Component Value Date   HGBA1C 7.5* 05/20/2013   Improved control with the addition of DPP4 agent.  Plan Continue same medications  F/u lab in Jan '15

## 2013-07-14 NOTE — Assessment & Plan Note (Signed)
Last lipid panel Aug 6: LDl 51, HDL 33,. Normal liver functions - good control  Plan Continue present medications.

## 2013-07-14 NOTE — Assessment & Plan Note (Signed)
Stable and current with cardiology.

## 2013-07-14 NOTE — Assessment & Plan Note (Signed)
BP Readings from Last 3 Encounters:  07/13/13 152/66  01/08/13 160/70  07/29/12 128/64   Borderline control.   Plan Continue present medications  Monitor BP at home and report back using MyChart - recommendations to follow.

## 2013-08-05 ENCOUNTER — Other Ambulatory Visit: Payer: Self-pay | Admitting: Internal Medicine

## 2013-08-12 ENCOUNTER — Other Ambulatory Visit: Payer: Self-pay | Admitting: Internal Medicine

## 2013-09-11 ENCOUNTER — Encounter: Payer: Self-pay | Admitting: Internal Medicine

## 2013-09-15 ENCOUNTER — Telehealth: Payer: Self-pay | Admitting: Internal Medicine

## 2013-09-15 NOTE — Telephone Encounter (Signed)
Ay add on Wed or Clovis Cao - what ever works best for him

## 2013-09-15 NOTE — Telephone Encounter (Signed)
Patient Information:  Caller Name: Jakaree  Phone: [phone]  Patient: Troy Moore, Troy Moore  Gender: Male  DOB: [birth year]  Age: 67 Years  PCP: Illene Regulus (Adults only)  Office Follow Up:  Does the office need to follow up with this patient?: Yes  Instructions For The Office: Please call and advise   Symptoms  Reason For Call & Symptoms: Pt is calling about his left arm feeling numb from the shoulder down or the elbow to the hand  intermittently. Onset 30 days ago. No injury to the shoulder. Pt has felt as if he has had arthritis /bursitis in than shoulder for years.  Reviewed Health History In EMR: Yes  Reviewed Medications In EMR: Yes  Reviewed Allergies In EMR: Yes  Reviewed Surgeries / Procedures: Yes  Date of Onset of Symptoms: 08/17/2013  Guideline(s) Used:  Neurologic Deficit  Disposition Per Guideline:   Go to Office Now  Reason For Disposition Reached:   Neurologic deficit that was transient (now gone), ANY of the following:   Weakness of the face, arm, or leg on one side of the body  Numbness of the face, arm, or leg on one side of the body  Loss of speech or garbled speech  Advice Given:  N/A  Patient Refused Recommendation:  Patient Will Make Own Appointment  He refuses ED. He is in [place] and can't make it in time. He insists on RN scheduling him. Rn sent note to office (not comfortable scheduling appt that should be seen today). Please call him back and advise.

## 2013-09-16 ENCOUNTER — Encounter: Payer: Self-pay | Admitting: Internal Medicine

## 2013-09-16 ENCOUNTER — Ambulatory Visit (INDEPENDENT_AMBULATORY_CARE_PROVIDER_SITE_OTHER): Payer: Medicare Other | Admitting: Internal Medicine

## 2013-09-16 ENCOUNTER — Other Ambulatory Visit (INDEPENDENT_AMBULATORY_CARE_PROVIDER_SITE_OTHER): Payer: Medicare Other

## 2013-09-16 ENCOUNTER — Ambulatory Visit (INDEPENDENT_AMBULATORY_CARE_PROVIDER_SITE_OTHER)
Admission: RE | Admit: 2013-09-16 | Discharge: 2013-09-16 | Disposition: A | Discharge status: Home / Self Care | Payer: Medicare Other | Source: Ambulatory Visit | Attending: Internal Medicine | Admitting: Internal Medicine

## 2013-09-16 VITALS — BP 116/60 | HR 65 | Temp 98.2°F | Wt 180.0 lb

## 2013-09-16 DIAGNOSIS — E119 Type 2 diabetes mellitus without complications: Secondary | ICD-10-CM

## 2013-09-16 DIAGNOSIS — R209 Unspecified disturbances of skin sensation: Secondary | ICD-10-CM

## 2013-09-16 DIAGNOSIS — Z954 Presence of other heart-valve replacement: Secondary | ICD-10-CM

## 2013-09-16 DIAGNOSIS — M79609 Pain in unspecified limb: Secondary | ICD-10-CM

## 2013-09-16 LAB — PROTIME-INR
INR: 1.8 ratio — ABNORMAL HIGH (ref 0.8–1.0)
Prothrombin Time: 18.9 s — ABNORMAL HIGH (ref 10.2–12.4)

## 2013-09-16 LAB — HEMOGLOBIN A1C: Hgb A1c MFr Bld: 8.1 % — ABNORMAL HIGH (ref 4.6–6.5)

## 2013-09-16 NOTE — Progress Notes (Signed)
Pre visit review using our clinic review tool, if applicable. No additional management support is needed unless otherwise documented below in the visit note. 

## 2013-09-16 NOTE — Patient Instructions (Signed)
Paresthesia left arm and hand - tingling sensation. Suspect this may be coming form the cervical spine, either from arthritic change or possibly minor degenerative disk disease. Definitely not a surgical issue!!  Plan C-spine x ray to look for arthritis or by inference disk disease  If there is evidence of nerve compression the first treatment will be a course of prednisone.  Diabetes - will check an A1C today with recommendations to follow   INR - will check today.    Please sign up for MyChart - it is definitely better then the call-a-nurse system.

## 2013-09-16 NOTE — Telephone Encounter (Signed)
I let patient know he can be seen today or tomorrow, per Dr Debby Bud. He states he will call back in 5 minutes.

## 2013-09-16 NOTE — Progress Notes (Signed)
Subjective:    Patient ID: Troy Moore, male    DOB: 01-14-1946, 67 y.o.   MRN: [id number]  HPI Paresthesia of the left UE, worse in the hand. No loss of strength but it feels like tingling even at rest. No recent injury. No history of DDD c-spine.  PMH, FamHx and SocHx reviewed for any changes and relevance.  Current Outpatient Prescriptions on File Prior to Visit  Medication Sig Dispense Refill  . amLODipine (NORVASC) 10 MG tablet Take one tablet by mouth one time daily  90 tablet  2  . brimonidine (ALPHAGAN) 0.2 % ophthalmic solution Place 1 drop into both eyes 2 (two) times daily.      . CRESTOR 10 MG tablet Take one tablet by mouth every evening  90 tablet  0  . fenofibrate 160 MG tablet Take one tablet by mouth one time daily  90 tablet  4  . furosemide (LASIX) 40 MG tablet TAKE ONE TABLET BY MOUTH ONE TIME DAILY   90 tablet  3  . glipiZIDE (GLUCOTROL) 10 MG tablet TAKE HALF TABLET BY MOUTH TWICE DAILY   90 tablet  5  . latanoprost (XALATAN) 0.005 % ophthalmic solution 1 drop at bedtime.        . metFORMIN (GLUCOPHAGE) 1000 MG tablet Take one tablet by mouth twice daily  180 tablet  2  . metoprolol (LOPRESSOR) 50 MG tablet Take 1 tablet (50 mg total) by mouth 2 (two) times daily.  180 tablet  1  . MULTIPLE VITAMIN PO Take by mouth.        . ONGLYZA 5 MG TABS tablet Take one tablet by mouth one time daily  30 tablet  5  . potassium chloride (K-DUR) 10 MEQ tablet TAKE ONE TABLET BY MOUTH TWICE DAILY  180 tablet  1  . potassium chloride (K-DUR) 10 MEQ tablet TAKE ONE TABLET BY MOUTH TWICE DAILY   180 tablet  1  . promethazine-codeine (PHENERGAN WITH CODEINE) 6.25-10 MG/5ML syrup Take 5 mLs by mouth every 4 (four) hours as needed.  240 mL  2  . vitamin C (ASCORBIC ACID) 500 MG tablet Take 500 mg by mouth 2 (two) times daily.        Marland Kitchen warfarin (COUMADIN) 7.5 MG tablet Take one tablet by mouth one time daily  90 tablet  2  . zolpidem (AMBIEN) 10 MG tablet Take 10 mg by mouth at  bedtime as needed.         No current facility-administered medications on file prior to visit.      Review of Systems System review is negative for any constitutional, cardiac, pulmonary, GI or neuro symptoms or complaints other than as described in the HPI.     Objective:   Physical Exam Filed Vitals:   09/16/13 1153  BP: 116/60  Pulse: 65  Temp: 98.2 F (36.8 C)   Gen'l WNWD man in no distress HEENT- C&S clear Neck - supple with full active ROM Cor- RRR Pulm - normal MSK - normal ROM Left shoulder, UE and hand Neuro - normal sensation to light touch and pin-prick left hand and UE, DTR 2+ at biceps and tricep.       Assessment & Plan:  Paresthesia Left UE - suspect central origin due to DDD or DJD c-spine.  Plan C-spine series.   FINDINGS:  No prevertebral soft tissue swelling. There is multiple levels of  bulky endplate osteophytosis from C4 through T1. Oblique projections  demonstrate no clear  neural foraminal stenosis. Open mouth odontoid  view is normal.  IMPRESSION:  Multilevel disc osteophytic disease. No acute findings.

## 2013-10-21 ENCOUNTER — Telehealth: Payer: Self-pay | Admitting: *Deleted

## 2013-10-21 NOTE — Telephone Encounter (Signed)
K

## 2013-10-21 NOTE — Telephone Encounter (Signed)
Pharmacist from [facility] phoned requesting 90day supply for patient onglyza instead of a 30d supply.  Please advise.   Pharmacy CB# [phone]

## 2013-10-22 MED ORDER — SAXAGLIPTIN HCL 5 MG PO TABS: ORAL_TABLET | ORAL | Status: DC

## 2013-10-22 NOTE — Telephone Encounter (Signed)
Quantity to be dispensed for onglyza increased to 90 day supply per MD & sent to pharmacy

## 2013-10-28 ENCOUNTER — Other Ambulatory Visit: Payer: Self-pay | Admitting: Internal Medicine

## 2013-10-30 ENCOUNTER — Encounter: Payer: Self-pay | Admitting: Internal Medicine

## 2013-11-09 ENCOUNTER — Telehealth: Payer: Self-pay | Admitting: Cardiology

## 2013-11-09 NOTE — Telephone Encounter (Signed)
New message    Having MRI on Wednesday--need to know what kind of heart valve he has------fax info to [place] [facility] [phone]

## 2013-11-09 NOTE — Telephone Encounter (Signed)
Office note printed and taken to MR to be faxed.

## 2013-11-23 ENCOUNTER — Other Ambulatory Visit: Payer: Self-pay | Admitting: Internal Medicine

## 2013-11-28 ENCOUNTER — Other Ambulatory Visit: Payer: Self-pay | Admitting: Internal Medicine

## 2013-12-05 ENCOUNTER — Other Ambulatory Visit: Payer: Self-pay | Admitting: Internal Medicine

## 2013-12-07 ENCOUNTER — Ambulatory Visit (INDEPENDENT_AMBULATORY_CARE_PROVIDER_SITE_OTHER): Payer: Medicare Other | Admitting: *Deleted

## 2013-12-07 ENCOUNTER — Telehealth: Payer: Self-pay | Admitting: Cardiology

## 2013-12-07 DIAGNOSIS — Z5181 Encounter for therapeutic drug level monitoring: Secondary | ICD-10-CM

## 2013-12-07 DIAGNOSIS — Z954 Presence of other heart-valve replacement: Secondary | ICD-10-CM

## 2013-12-07 MED ORDER — ENOXAPARIN SODIUM 120 MG/0.8ML ~~LOC~~ SOLN: 120.0000 mg | SUBCUTANEOUS | Status: DC

## 2013-12-07 NOTE — Patient Instructions (Addendum)
12/07/13- Pt took Coumadin today, No more coumadin until after procedure.  12/08/13- Do Nothing  12/09/13- Do Nothing  12/10/13- Start Lovenox 120mg s  Injection Subcutaneously into abdomen at 8am.  12/11/13- Continue Lovenox 120mg s injection  Subcutaneously into abdomen at 8am.  12/12/13- Continue Lovenox 120mg s injection  Subcutaneously into abdomen at 8am.  12/13/13- Continue Lovenox injection 120mg s Subcutaneously into abdomen at 8am, Last Lovenox injection.  12/14/13- Day of Procedure  12/15/13-When restart coumadin take 1.5 tablets for 2 days on 3/3 and 3/4 then resume normal dosage. Also resume Lovenox 120mg s injections  subcutaneously into abdomen once daily every 24 hours until follow up appt. On 12/21/13 at 12:45pm.

## 2013-12-07 NOTE — Telephone Encounter (Signed)
Pt is scheduled for 12/07/2013 (today) to start Lovenox injections.  Information to be faxed to [facility] at [phone]

## 2013-12-07 NOTE — Telephone Encounter (Signed)
New message  Q w/ [facility] called.. Pt will have Procedure completed on 12/14/2013. [facility] called, med clearance not recieved// DISCO blood thinner for 7 days.

## 2013-12-21 ENCOUNTER — Ambulatory Visit (INDEPENDENT_AMBULATORY_CARE_PROVIDER_SITE_OTHER): Payer: Medicare Other | Admitting: *Deleted

## 2013-12-21 DIAGNOSIS — Z5181 Encounter for therapeutic drug level monitoring: Secondary | ICD-10-CM

## 2013-12-21 DIAGNOSIS — Z954 Presence of other heart-valve replacement: Secondary | ICD-10-CM

## 2013-12-21 NOTE — Patient Instructions (Signed)
INR today 1.6. Today take an extra 1/2 tablet, tomorrow take 1.5 tablets and also continue Lovenox 120mg  injections once daily every 24 hours. Check your INR on 12/23/13 and call us at [phone] with your result.

## 2014-02-02 ENCOUNTER — Other Ambulatory Visit: Payer: Self-pay | Admitting: *Deleted

## 2014-02-02 MED ORDER — POTASSIUM CHLORIDE ER 10 MEQ PO TBCR: EXTENDED_RELEASE_TABLET | ORAL | Status: AC

## 2014-03-02 ENCOUNTER — Encounter: Payer: Self-pay | Admitting: Internal Medicine

## 2014-03-05 ENCOUNTER — Other Ambulatory Visit: Payer: Self-pay | Admitting: *Deleted

## 2014-03-05 MED ORDER — METFORMIN HCL 1000 MG PO TABS: ORAL_TABLET | ORAL | Status: DC

## 2014-03-09 ENCOUNTER — Other Ambulatory Visit: Payer: Self-pay

## 2014-03-09 MED ORDER — ROSUVASTATIN CALCIUM 10 MG PO TABS: 10.0000 mg | ORAL_TABLET | Freq: Every day | ORAL | Status: DC

## 2014-03-11 ENCOUNTER — Encounter: Payer: Self-pay | Admitting: Cardiology

## 2014-03-11 ENCOUNTER — Ambulatory Visit (INDEPENDENT_AMBULATORY_CARE_PROVIDER_SITE_OTHER): Payer: Medicare Other | Admitting: Cardiology

## 2014-03-11 VITALS — BP 134/62 | HR 80 | Ht 71.0 in | Wt 180.0 lb

## 2014-03-11 DIAGNOSIS — Z79899 Other long term (current) drug therapy: Secondary | ICD-10-CM

## 2014-03-11 DIAGNOSIS — I10 Essential (primary) hypertension: Secondary | ICD-10-CM

## 2014-03-11 DIAGNOSIS — E119 Type 2 diabetes mellitus without complications: Secondary | ICD-10-CM

## 2014-03-11 DIAGNOSIS — E7800 Pure hypercholesterolemia, unspecified: Secondary | ICD-10-CM

## 2014-03-11 DIAGNOSIS — I2510 Atherosclerotic heart disease of native coronary artery without angina pectoris: Secondary | ICD-10-CM

## 2014-03-11 DIAGNOSIS — Z954 Presence of other heart-valve replacement: Secondary | ICD-10-CM

## 2014-03-11 DIAGNOSIS — Z125 Encounter for screening for malignant neoplasm of prostate: Secondary | ICD-10-CM

## 2014-03-11 NOTE — Progress Notes (Signed)
HPI The patient presents for yearly followup. Since I last saw him he has done well. He has had no new cardiovascular complaints. 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 With this he denies any chest pressure, neck or arm discomfort. He denies any palpitations, presyncope or syncope. He has no shortness of breath, PND or orthopnea. He has had no weight gain or edema.   Allergies  Allergen Reactions  . Benazepril     cough  . Niacin     Current Outpatient Prescriptions  Medication Sig Dispense Refill  . amLODipine (NORVASC) 10 MG tablet Take one tablet by mouth one time daily  90 tablet  2  . brimonidine (ALPHAGAN) 0.2 % ophthalmic solution Place 1 drop into both eyes 2 (two) times daily.      . fenofibrate 160 MG tablet Take one tablet by mouth one time daily  90 tablet  4  . furosemide (LASIX) 40 MG tablet TAKE ONE TABLET BY MOUTH ONE TIME DAILY   90 tablet  3  . gabapentin (NEURONTIN) 300 MG capsule Take 300 mg by mouth at bedtime. Takes 2 capsules QHS      . glipiZIDE (GLUCOTROL) 10 MG tablet TAKE HALF TABLET BY MOUTH TWICE DAILY   90 tablet  5  . latanoprost (XALATAN) 0.005 % ophthalmic solution 1 drop at bedtime.        . metFORMIN (GLUCOPHAGE) 1000 MG tablet Take one tablet by mouth twice daily  180 tablet  1  . metoprolol (LOPRESSOR) 50 MG tablet Take one tablet by mouth twice daily.  180 tablet  3  . MULTIPLE VITAMIN PO Take by mouth.        . potassium chloride (K-DUR) 10 MEQ tablet Take one tablet by mouth twice daily.  180 tablet  0  . promethazine-codeine (PHENERGAN WITH CODEINE) 6.25-10 MG/5ML syrup TAKE ONE TEASPOONFUL BY MOUTH EVERY FOUR HOURS AS NEEDED   240 mL  1  . rosuvastatin (CRESTOR) 10 MG tablet Take 1 tablet (10 mg total) by mouth daily.  90 tablet  0  . saxagliptin HCl (ONGLYZA) 5 MG TABS tablet Take one tablet by mouth one time daily  90 tablet  3  .  vitamin C (ASCORBIC ACID) 500 MG tablet Take 500 mg by mouth 2 (two) times daily.        Marland Kitchen warfarin (COUMADIN) 7.5 MG tablet Take one tablet by mouth one time daily  90 tablet  2  . zolpidem (AMBIEN) 10 MG tablet Take 10 mg by mouth at bedtime as needed.         No current facility-administered medications for this visit.    Past Medical History  Diagnosis Date  . Back pain   . Hypertension   . Hyperlipidemia   . Diabetes mellitus type II   . CAD (coronary artery disease)   . Glaucoma   . Rosacea   . Adenomatous polyps     10/22/2003    Past Surgical History  Procedure Laterality Date  . Appendectomy    . Coronary artery bypass graft      bental procedure (Av[facility]) : LIMA-LAD, SVG- Cx, RCA)  . Cholecystectomy      2003  . Nasal septum surgery    . Aortic valve replacement      [employer] Jude's valve 2000    ROS:  As stated in the HPI and  negative for all other systems.  PHYSICAL EXAM BP 134/62  Pulse 80  Ht 5\' 11"  (1.803 m)  Wt 180 lb (81.647 kg)  BMI 25.12 kg/m2 GENERAL:  Well appearing NECK:  No jugular venous distention, waveform within normal limits, carotid upstroke brisk and symmetric, no bruits, no thyromegaly LUNGS:  Clear to auscultation bilaterally BACK:  No CVA tenderness CHEST:  Well healed sternotomy scar. HEART:  PMI not displaced or sustained,S1 and S2 mechanical, no S3, no S4, no clicks, no rubs, slight systolic murmur heard at the apex, no diastolic murmurs ABD:  Flat, positive bowel sounds normal in frequency in pitch, no bruits, no rebound, no guarding, no midline pulsatile mass, no hepatomegaly, no splenomegaly EXT:  2 plus pulses throughout, no edema, no cyanosis no clubbing   EKG:  Sinus rhythm, right bundle branch block, rate 59 left anterior fascicular block, rate 80, premature ectopic complexes.   03/11/2014   ASSESSMENT AND PLAN  Aortic valve replacement with Bental:  Has been no change to suggest a problem since his echocardiogram in2013. No  further testing is indicated. He follows his own INR is and these are therapeutic.  I will check some routine blood work to include a CBC.   CAD:  The patient has no new sypmtoms.  No further cardiovascular testing is indicated.  We will continue with aggressive risk reduction and meds as listed.  HEALTH MAINTENANCE:   He requests a PSA and I will order this.  (He is currently between primary care MDs.).  I will check an A1C and a lipid profile as well.

## 2014-03-11 NOTE — Patient Instructions (Signed)
The current medical regimen is effective;  continue present plan and medications.  Please return for fasting blood work (CBC, LIPID, LIVER, INR, PSA and HA1C)  Follow up in 1 year with Dr Percival Spanish.  You will receive a letter in the mail 2 months before you are due.  Please call us when you receive this letter to schedule your follow up appointment.

## 2014-03-26 ENCOUNTER — Other Ambulatory Visit (INDEPENDENT_AMBULATORY_CARE_PROVIDER_SITE_OTHER): Payer: Medicare Other

## 2014-03-26 DIAGNOSIS — Z125 Encounter for screening for malignant neoplasm of prostate: Secondary | ICD-10-CM

## 2014-03-26 DIAGNOSIS — Z79899 Other long term (current) drug therapy: Secondary | ICD-10-CM

## 2014-03-26 DIAGNOSIS — Z954 Presence of other heart-valve replacement: Secondary | ICD-10-CM

## 2014-03-26 DIAGNOSIS — E7800 Pure hypercholesterolemia, unspecified: Secondary | ICD-10-CM

## 2014-03-26 DIAGNOSIS — E119 Type 2 diabetes mellitus without complications: Secondary | ICD-10-CM

## 2014-03-26 DIAGNOSIS — I10 Essential (primary) hypertension: Secondary | ICD-10-CM

## 2014-03-26 DIAGNOSIS — I2510 Atherosclerotic heart disease of native coronary artery without angina pectoris: Secondary | ICD-10-CM

## 2014-03-26 LAB — CBC
HCT: 34.3 % — ABNORMAL LOW (ref 39.0–52.0)
Hemoglobin: 11.7 g/dL — ABNORMAL LOW (ref 13.0–17.0)
MCHC: 34.3 g/dL (ref 30.0–36.0)
MCV: 88.9 fl (ref 78.0–100.0)
PLATELETS: 128 10*3/uL — AB (ref 150.0–400.0)
RBC: 3.85 Mil/uL — ABNORMAL LOW (ref 4.22–5.81)
RDW: 13.5 % (ref 11.5–15.5)
WBC: 4.4 10*3/uL (ref 4.0–10.5)

## 2014-03-26 LAB — HEPATIC FUNCTION PANEL
ALT: 20 U/L (ref 0–53)
AST: 24 U/L (ref 0–37)
Albumin: 4.1 g/dL (ref 3.5–5.2)
Alkaline Phosphatase: 43 U/L (ref 39–117)
BILIRUBIN TOTAL: 0.7 mg/dL (ref 0.2–1.2)
Bilirubin, Direct: 0.2 mg/dL (ref 0.0–0.3)
TOTAL PROTEIN: 6.4 g/dL (ref 6.0–8.3)

## 2014-03-26 LAB — LIPID PANEL
Cholesterol: 122 mg/dL (ref 0–200)
HDL: 31.8 mg/dL — ABNORMAL LOW (ref >39.00)
LDL CALC: 45 mg/dL (ref 0–99)
NonHDL: 90.2
TRIGLYCERIDES: 225 mg/dL — AB (ref 0.0–149.0)
Total CHOL/HDL Ratio: 4
VLDL: 45 mg/dL — ABNORMAL HIGH (ref 0.0–40.0)

## 2014-03-26 LAB — PROTIME-INR
INR: 4.5 ratio — ABNORMAL HIGH (ref 0.8–1.0)
PROTHROMBIN TIME: 48.4 s — AB (ref 9.6–13.1)

## 2014-03-26 LAB — HEMOGLOBIN A1C: HEMOGLOBIN A1C: 9 % — AB (ref 4.6–6.5)

## 2014-03-26 LAB — PSA: PSA: 0.51 ng/mL (ref 0.10–4.00)

## 2014-03-31 LAB — HM DIABETES EYE EXAM

## 2014-04-02 ENCOUNTER — Other Ambulatory Visit: Payer: Self-pay | Admitting: *Deleted

## 2014-04-02 ENCOUNTER — Telehealth: Payer: Self-pay | Admitting: Cardiology

## 2014-04-02 DIAGNOSIS — D61818 Other pancytopenia: Secondary | ICD-10-CM

## 2014-04-02 DIAGNOSIS — Z79899 Other long term (current) drug therapy: Secondary | ICD-10-CM

## 2014-04-02 DIAGNOSIS — Z952 Presence of prosthetic heart valve: Secondary | ICD-10-CM

## 2014-04-02 NOTE — Telephone Encounter (Signed)
New message      Returning Pam's call

## 2014-04-02 NOTE — Telephone Encounter (Signed)
Lm to cb to discuss labs

## 2014-04-05 NOTE — Telephone Encounter (Signed)
Reviewed labs have pt - per pt - he adjusts his own coumadin based on INRs.  Advised pt he needs to est. With a PCP to adjust coumadin based on regular INR's.

## 2014-04-27 ENCOUNTER — Other Ambulatory Visit (INDEPENDENT_AMBULATORY_CARE_PROVIDER_SITE_OTHER): Payer: Medicare Other

## 2014-04-27 DIAGNOSIS — Z79899 Other long term (current) drug therapy: Secondary | ICD-10-CM

## 2014-04-27 DIAGNOSIS — Z952 Presence of prosthetic heart valve: Secondary | ICD-10-CM

## 2014-04-27 DIAGNOSIS — D61818 Other pancytopenia: Secondary | ICD-10-CM

## 2014-04-27 DIAGNOSIS — Z954 Presence of other heart-valve replacement: Secondary | ICD-10-CM

## 2014-04-27 LAB — CBC
HEMATOCRIT: 33.9 % — AB (ref 39.0–52.0)
Hemoglobin: 11.7 g/dL — ABNORMAL LOW (ref 13.0–17.0)
MCHC: 34.5 g/dL (ref 30.0–36.0)
MCV: 88.8 fl (ref 78.0–100.0)
Platelets: 150 10*3/uL (ref 150.0–400.0)
RBC: 3.82 Mil/uL — AB (ref 4.22–5.81)
RDW: 13.3 % (ref 11.5–15.5)
WBC: 5.9 10*3/uL (ref 4.0–10.5)

## 2014-04-27 LAB — PROTIME-INR
INR: 1.1 ratio — AB (ref 0.8–1.0)
Prothrombin Time: 12.7 s (ref 9.6–13.1)

## 2014-04-28 ENCOUNTER — Telehealth: Payer: Self-pay

## 2014-04-28 NOTE — Telephone Encounter (Signed)
Spoke to patient in regards to scheduling his CPE and setting him up with a new PCP.   Email to [place] in regards to pt concerns with the practice

## 2014-04-29 ENCOUNTER — Other Ambulatory Visit: Payer: Medicare Other

## 2014-04-29 LAB — HEMOCCULT SLIDES (X 3 CARDS)
Fecal Occult Blood: NEGATIVE
OCCULT 1: NEGATIVE
OCCULT 2: NEGATIVE
OCCULT 3: NEGATIVE
OCCULT 4: NEGATIVE
OCCULT 5: NEGATIVE

## 2014-04-30 ENCOUNTER — Encounter: Payer: Self-pay | Admitting: Gastroenterology

## 2014-05-06 ENCOUNTER — Other Ambulatory Visit: Payer: Self-pay

## 2014-05-06 MED ORDER — AMLODIPINE BESYLATE 10 MG PO TABS: ORAL_TABLET | ORAL | Status: DC

## 2014-05-14 NOTE — Telephone Encounter (Signed)
I spoke with Mr Troy Moore today, 05-14-2014, and he says that everything is fine now.  He needed to understand the process for establishing with a new provider since Dr. Linda Hedges' retirement.  He stated he appreciated the call and that I had " done everything that needed to be done to answer his questions".  He states "I am very happy now".

## 2014-05-27 ENCOUNTER — Encounter: Payer: Self-pay | Admitting: Gastroenterology

## 2014-05-31 ENCOUNTER — Other Ambulatory Visit: Payer: Self-pay

## 2014-05-31 MED ORDER — WARFARIN SODIUM 7.5 MG PO TABS
ORAL_TABLET | ORAL | Status: DC
Start: 2014-05-31 — End: 2014-12-16

## 2014-06-03 ENCOUNTER — Other Ambulatory Visit: Payer: Self-pay | Admitting: Cardiology

## 2014-06-04 ENCOUNTER — Other Ambulatory Visit: Payer: Self-pay | Admitting: Internal Medicine

## 2014-06-10 ENCOUNTER — Other Ambulatory Visit: Payer: Self-pay | Admitting: Internal Medicine

## 2014-07-19 ENCOUNTER — Ambulatory Visit (INDEPENDENT_AMBULATORY_CARE_PROVIDER_SITE_OTHER): Payer: Medicare Other | Admitting: Internal Medicine

## 2014-07-19 ENCOUNTER — Other Ambulatory Visit (INDEPENDENT_AMBULATORY_CARE_PROVIDER_SITE_OTHER): Payer: Medicare Other

## 2014-07-19 ENCOUNTER — Encounter: Payer: Self-pay | Admitting: Internal Medicine

## 2014-07-19 VITALS — BP 138/68 | HR 65 | Temp 97.7°F | Resp 16 | Ht 71.0 in | Wt 178.0 lb

## 2014-07-19 DIAGNOSIS — E118 Type 2 diabetes mellitus with unspecified complications: Secondary | ICD-10-CM

## 2014-07-19 DIAGNOSIS — Z860100 Personal history of colon polyps, unspecified: Secondary | ICD-10-CM

## 2014-07-19 DIAGNOSIS — Z0000 Encounter for general adult medical examination without abnormal findings: Secondary | ICD-10-CM

## 2014-07-19 DIAGNOSIS — E785 Hyperlipidemia, unspecified: Secondary | ICD-10-CM

## 2014-07-19 DIAGNOSIS — Z7901 Long term (current) use of anticoagulants: Secondary | ICD-10-CM | POA: Insufficient documentation

## 2014-07-19 DIAGNOSIS — D510 Vitamin B12 deficiency anemia due to intrinsic factor deficiency: Secondary | ICD-10-CM

## 2014-07-19 DIAGNOSIS — N183 Chronic kidney disease, stage 3 unspecified: Secondary | ICD-10-CM

## 2014-07-19 DIAGNOSIS — D519 Vitamin B12 deficiency anemia, unspecified: Secondary | ICD-10-CM | POA: Insufficient documentation

## 2014-07-19 DIAGNOSIS — I10 Essential (primary) hypertension: Secondary | ICD-10-CM

## 2014-07-19 DIAGNOSIS — G4700 Insomnia, unspecified: Secondary | ICD-10-CM

## 2014-07-19 DIAGNOSIS — Z23 Encounter for immunization: Secondary | ICD-10-CM

## 2014-07-19 LAB — CBC WITH DIFFERENTIAL/PLATELET
BASOS ABS: 0 10*3/uL (ref 0.0–0.1)
BASOS PCT: 0.3 % (ref 0.0–3.0)
EOS ABS: 0.2 10*3/uL (ref 0.0–0.7)
Eosinophils Relative: 4.4 % (ref 0.0–5.0)
HCT: 35.8 % — ABNORMAL LOW (ref 39.0–52.0)
Hemoglobin: 12.3 g/dL — ABNORMAL LOW (ref 13.0–17.0)
Lymphocytes Relative: 17.8 % (ref 12.0–46.0)
Lymphs Abs: 0.7 10*3/uL (ref 0.7–4.0)
MCHC: 34.5 g/dL (ref 30.0–36.0)
MCV: 89.2 fl (ref 78.0–100.0)
MONO ABS: 0.3 10*3/uL (ref 0.1–1.0)
Monocytes Relative: 7.5 % (ref 3.0–12.0)
Neutro Abs: 2.9 10*3/uL (ref 1.4–7.7)
Neutrophils Relative %: 70 % (ref 43.0–77.0)
PLATELETS: 149 10*3/uL — AB (ref 150.0–400.0)
RBC: 4.01 Mil/uL — AB (ref 4.22–5.81)
RDW: 13.3 % (ref 11.5–15.5)
WBC: 4.1 10*3/uL (ref 4.0–10.5)

## 2014-07-19 LAB — HEMOGLOBIN A1C: HEMOGLOBIN A1C: 8.8 % — AB (ref 4.6–6.5)

## 2014-07-19 LAB — BASIC METABOLIC PANEL
BUN: 12 mg/dL (ref 6–23)
CHLORIDE: 100 meq/L (ref 96–112)
CO2: 24 mEq/L (ref 19–32)
CREATININE: 1.3 mg/dL (ref 0.4–1.5)
Calcium: 9.4 mg/dL (ref 8.4–10.5)
GFR: 59.57 mL/min — ABNORMAL LOW (ref >60.00)
Glucose, Bld: 291 mg/dL — ABNORMAL HIGH (ref 70–99)
POTASSIUM: 4.1 meq/L (ref 3.5–5.1)
Sodium: 135 mEq/L (ref 135–145)

## 2014-07-19 LAB — RETICULOCYTES
ABS Retic: 89.5 10*3/uL (ref 19.0–186.0)
RBC.: 4.07 MIL/uL — AB (ref 4.22–5.81)
Retic Ct Pct: 2.2 % (ref 0.4–2.3)

## 2014-07-19 LAB — HM DIABETES FOOT EXAM: HM Diabetic Foot Exam: NORMAL

## 2014-07-19 LAB — URINALYSIS, ROUTINE W REFLEX MICROSCOPIC
BILIRUBIN URINE: NEGATIVE
Hgb urine dipstick: NEGATIVE
KETONES UR: NEGATIVE
LEUKOCYTES UA: NEGATIVE
Nitrite: NEGATIVE
PH: 5.5 (ref 5.0–8.0)
Specific Gravity, Urine: 1.025 (ref 1.000–1.030)
Total Protein, Urine: 100 — AB
Urobilinogen, UA: 0.2 (ref 0.0–1.0)

## 2014-07-19 LAB — PROTIME-INR
INR: 1.1 ratio — ABNORMAL HIGH (ref 0.8–1.0)
PROTHROMBIN TIME: 12.2 s (ref 9.6–13.1)

## 2014-07-19 LAB — IBC PANEL
Iron: 86 ug/dL (ref 42–165)
Saturation Ratios: 19.7 % — ABNORMAL LOW (ref 20.0–50.0)
Transferrin: 311.9 mg/dL (ref 212.0–360.0)

## 2014-07-19 LAB — VITAMIN D 25 HYDROXY (VIT D DEFICIENCY, FRACTURES): VITD: 30.71 ng/mL (ref 30.00–100.00)

## 2014-07-19 LAB — TSH: TSH: 3.12 u[IU]/mL (ref 0.35–4.50)

## 2014-07-19 LAB — VITAMIN B12: Vitamin B-12: 219 pg/mL (ref 211–911)

## 2014-07-19 LAB — FERRITIN: Ferritin: 92.6 ng/mL (ref 22.0–322.0)

## 2014-07-19 LAB — FOLATE

## 2014-07-19 MED ORDER — SUVOREXANT 15 MG PO TABS: 1.0000 | ORAL_TABLET | Freq: Every evening | ORAL | Status: AC | PRN

## 2014-07-19 MED ORDER — PROMETHAZINE-CODEINE 6.25-10 MG/5ML PO SYRP: 5.0000 mL | ORAL_SOLUTION | ORAL | Status: DC | PRN

## 2014-07-19 MED ORDER — INSULIN GLARGINE 300 UNIT/ML ~~LOC~~ SOPN: 30.0000 [IU] | PEN_INJECTOR | Freq: Every day | SUBCUTANEOUS | Status: AC

## 2014-07-19 NOTE — Patient Instructions (Signed)
Type 2 Diabetes Mellitus Type 2 diabetes mellitus, often simply referred to as type 2 diabetes, is a long-lasting (chronic) disease. In type 2 diabetes, the pancreas does not make enough insulin (a hormone), the cells are less responsive to the insulin that is made (insulin resistance), or both. Normally, insulin moves sugars from food into the tissue cells. The tissue cells use the sugars for energy. The lack of insulin or the lack of normal response to insulin causes excess sugars to build up in the blood instead of going into the tissue cells. As a result, high blood sugar (hyperglycemia) develops. The effect of high sugar (glucose) levels can cause many complications. Type 2 diabetes was also previously called adult-onset diabetes, but it can occur at any age.  RISK FACTORS  A person is predisposed to developing type 2 diabetes if someone in the family has the disease and also has one or more of the following primary risk factors:  Overweight.  An inactive lifestyle.  A history of consistently eating high-calorie foods. Maintaining a normal weight and regular physical activity can reduce the chance of developing type 2 diabetes. SYMPTOMS  A person with type 2 diabetes may not show symptoms initially. The symptoms of type 2 diabetes appear slowly. The symptoms include:  Increased thirst (polydipsia).  Increased urination (polyuria).  Increased urination during the night (nocturia).  Weight loss. This weight loss may be rapid.  Frequent, recurring infections.  Tiredness (fatigue).  Weakness.  Vision changes, such as blurred vision.  Fruity smell to your breath.  Abdominal pain.  Nausea or vomiting.  Cuts or bruises which are slow to heal.  Tingling or numbness in the hands or feet. DIAGNOSIS Type 2 diabetes is frequently not diagnosed until complications of diabetes are present. Type 2 diabetes is diagnosed when symptoms or complications are present and when blood  glucose levels are increased. Your blood glucose level may be checked by one or more of the following blood tests:  A fasting blood glucose test. You will not be allowed to eat for at least 8 hours before a blood sample is taken.  A random blood glucose test. Your blood glucose is checked at any time of the day regardless of when you ate.  A hemoglobin A1c blood glucose test. A hemoglobin A1c test provides information about blood glucose control over the previous 3 months.  An oral glucose tolerance test (OGTT). Your blood glucose is measured after you have not eaten (fasted) for 2 hours and then after you drink a glucose-containing beverage. TREATMENT   You may need to take insulin or diabetes medicine daily to keep blood glucose levels in the desired range.  If you use insulin, you may need to adjust the dosage depending on the carbohydrates that you eat with each meal or snack. The treatment goal is to maintain the before meal blood sugar (preprandial glucose) level at 70-130 mg/dL. HOME CARE INSTRUCTIONS   Have your hemoglobin A1c level checked twice a year.  Perform daily blood glucose monitoring as directed by your health care provider.  Monitor urine ketones when you are ill and as directed by your health care provider.  Take your diabetes medicine or insulin as directed by your health care provider to maintain your blood glucose levels in the desired range.  Never run out of diabetes medicine or insulin. It is needed every day.  If you are using insulin, you may need to adjust the amount of insulin given based on your intake of   carbohydrates. Carbohydrates can raise blood glucose levels but need to be included in your diet. Carbohydrates provide vitamins, minerals, and fiber which are an essential part of a healthy diet. Carbohydrates are found in fruits, vegetables, whole grains, dairy products, legumes, and foods containing added sugars.  Eat healthy foods. You should make an  appointment to see a registered dietitian to help you create an eating plan that is right for you.  Lose weight if you are overweight.  Carry a medical alert card or wear your medical alert jewelry.  Carry a 15-gram carbohydrate snack with you at all times to treat low blood glucose (hypoglycemia). Some examples of 15-gram carbohydrate snacks include:  Glucose tablets, 3 or 4.  Glucose gel, 15-gram tube.  Raisins, 2 tablespoons (24 grams).  Jelly beans, 6.  Animal crackers, 8.  Regular pop, 4 ounces (120 mL).  Gummy treats, 9.  Recognize hypoglycemia. Hypoglycemia occurs with blood glucose levels of 70 mg/dL and below. The risk for hypoglycemia increases when fasting or skipping meals, during or after intense exercise, and during sleep. Hypoglycemia symptoms can include:  Tremors or shakes.  Decreased ability to concentrate.  Sweating.  Increased heart rate.  Headache.  Dry mouth.  Hunger.  Irritability.  Anxiety.  Restless sleep.  Altered speech or coordination.  Confusion.  Treat hypoglycemia promptly. If you are alert and able to safely swallow, follow the 15:15 rule:  Take 15-20 grams of rapid-acting glucose or carbohydrate. Rapid-acting options include glucose gel, glucose tablets, or 4 ounces (120 mL) of fruit juice, regular soda, or low-fat milk.  Check your blood glucose level 15 minutes after taking the glucose.  Take 15-20 grams more of glucose if the repeat blood glucose level is still 70 mg/dL or below.  Eat a meal or snack within 1 hour once blood glucose levels return to normal.  Be alert to feeling very thirsty and urinating more frequently than usual, which are early signs of hyperglycemia. An early awareness of hyperglycemia allows for prompt treatment. Treat hyperglycemia as directed by your health care provider.  Engage in at least 150 minutes of moderate-intensity physical activity a week, spread over at least 3 days of the week or as  directed by your health care provider. In addition, you should engage in resistance exercise at least 2 times a week or as directed by your health care provider. Try to spend no more than 90 minutes at one time inactive.  Adjust your medicine and food intake as needed if you start a new exercise or sport.  Follow your sick-day plan anytime you are unable to eat or drink as usual.  Do not use any tobacco products including cigarettes, chewing tobacco, or electronic cigarettes. If you need help quitting, ask your health care provider.  Limit alcohol intake to no more than 1 drink per day for nonpregnant women and 2 drinks per day for men. You should drink alcohol only when you are also eating food. Talk with your health care provider whether alcohol is safe for you. Tell your health care provider if you drink alcohol several times a week.  Keep all follow-up visits as directed by your health care provider. This is important.  Schedule an eye exam soon after the diagnosis of type 2 diabetes and then annually.  Perform daily skin and foot care. Examine your skin and feet daily for cuts, bruises, redness, nail problems, bleeding, blisters, or sores. A foot exam by a health care provider should be done annually.    Brush your teeth and gums at least twice a day and floss at least once a day. Follow up with your dentist regularly.  Share your diabetes management plan with your workplace or school.  Stay up-to-date with immunizations. It is recommended that people with diabetes who are over 65 years old get the pneumonia vaccine. In some cases, two separate shots may be given. Ask your health care provider if your pneumonia vaccination is up-to-date.  Learn to manage stress.  Obtain ongoing diabetes education and support as needed.  Participate in or seek rehabilitation as needed to maintain or improve independence and quality of life. Request a physical or occupational therapy referral if you are  having foot or hand numbness, or difficulties with grooming, dressing, eating, or physical activity. SEEK MEDICAL CARE IF:   You are unable to eat food or drink fluids for more than 6 hours.  You have nausea and vomiting for more than 6 hours.  Your blood glucose level is over 240 mg/dL.  There is a change in mental status.  You develop an additional serious illness.  You have diarrhea for more than 6 hours.  You have been sick or have had a fever for a couple of days and are not getting better.  You have pain during any physical activity.  SEEK IMMEDIATE MEDICAL CARE IF:  You have difficulty breathing.  You have moderate to large ketone levels. MAKE SURE YOU:  Understand these instructions.  Will watch your condition.  Will get help right away if you are not doing well or get worse. Document Released: 10/01/2005 Document Revised: 02/15/2014 Document Reviewed: 04/29/2012 Exit[employer] Patient Information 2015 [employer]. This information is not intended to replace advice given to you by your health care provider. Make sure you discuss any questions you have with your health care provider. Health Maintenance A healthy lifestyle and preventative care can promote health and wellness.  Maintain regular health, dental, and eye exams.  Eat a healthy diet. Foods like vegetables, fruits, whole grains, low-fat dairy products, and lean protein foods contain the nutrients you need and are low in calories. Decrease your intake of foods high in solid fats, added sugars, and salt. Get information about a proper diet from your health care provider, if necessary.  Regular physical exercise is one of the most important things you can do for your health. Most adults should get at least 150 minutes of moderate-intensity exercise (any activity that increases your heart rate and causes you to sweat) each week. In addition, most adults need muscle-strengthening exercises on 2 or more days a week.    Maintain a healthy weight. The body mass index (BMI) is a screening tool to identify possible weight problems. It provides an estimate of body fat based on height and weight. Your health care provider can find your BMI and can help you achieve or maintain a healthy weight. For males 20 years and older:  A BMI below 18.5 is considered underweight.  A BMI of 18.5 to 24.9 is normal.  A BMI of 25 to 29.9 is considered overweight.  A BMI of 30 and above is considered obese.  Maintain normal blood lipids and cholesterol by exercising and minimizing your intake of saturated fat. Eat a balanced diet with plenty of fruits and vegetables. Blood tests for lipids and cholesterol should begin at age 20 and be repeated every 5 years. If your lipid or cholesterol levels are high, you are over age 50, or you are at high   risk for heart disease, you may need your cholesterol levels checked more frequently.Ongoing high lipid and cholesterol levels should be treated with medicines if diet and exercise are not working.  If you smoke, find out from your health care provider how to quit. If you do not use tobacco, do not start.  Lung cancer screening is recommended for adults aged 55-80 years who are at high risk for developing lung cancer because of a history of smoking. A yearly low-dose CT scan of the lungs is recommended for people who have at least a 30-pack-year history of smoking and are current smokers or have quit within the past 15 years. A pack year of smoking is smoking an average of 1 pack of cigarettes a day for 1 year (for example, a 30-pack-year history of smoking could mean smoking 1 pack a day for 30 years or 2 packs a day for 15 years). Yearly screening should continue until the smoker has stopped smoking for at least 15 years. Yearly screening should be stopped for people who develop a health problem that would prevent them from having lung cancer treatment.  If you choose to drink alcohol, do not  have more than 2 drinks per day. One drink is considered to be 12 oz (360 mL) of beer, 5 oz (150 mL) of wine, or 1.5 oz (45 mL) of liquor.  Avoid the use of street drugs. Do not share needles with anyone. Ask for help if you need support or instructions about stopping the use of drugs.  High blood pressure causes heart disease and increases the risk of stroke. Blood pressure should be checked at least every 1-2 years. Ongoing high blood pressure should be treated with medicines if weight loss and exercise are not effective.  If you are 45-79 years old, ask your health care provider if you should take aspirin to prevent heart disease.  Diabetes screening involves taking a blood sample to check your fasting blood sugar level. This should be done once every 3 years after age 45 if you are at a normal weight and without risk factors for diabetes. Testing should be considered at a younger age or be carried out more frequently if you are overweight and have at least 1 risk factor for diabetes.  Colorectal cancer can be detected and often prevented. Most routine colorectal cancer screening begins at the age of 50 and continues through age 75. However, your health care provider may recommend screening at an earlier age if you have risk factors for colon cancer. On a yearly basis, your health care provider may provide home test kits to check for hidden blood in the stool. A small camera at the end of a tube may be used to directly examine the colon (sigmoidoscopy or colonoscopy) to detect the earliest forms of colorectal cancer. Talk to your health care provider about this at age 50 when routine screening begins. A direct exam of the colon should be repeated every 5-10 years through age 75, unless early forms of precancerous polyps or small growths are found.  People who are at an increased risk for hepatitis B should be screened for this virus. You are considered at high risk for hepatitis B if:  You were born  in a country where hepatitis B occurs often. Talk with your health care provider about which countries are considered high risk.  Your parents were born in a high-risk country and you have not received a shot to protect against hepatitis B (hepatitis B   vaccine).  You have HIV or AIDS.  You use needles to inject street drugs.  You live with, or have sex with, someone who has hepatitis B.  You are a man who has sex with other men (MSM).  You get hemodialysis treatment.  You take certain medicines for conditions like cancer, organ transplantation, and autoimmune conditions.  Hepatitis C blood testing is recommended for all people born from 1945 through 1965 and any individual with known risk factors for hepatitis C.  Healthy men should no longer receive prostate-specific antigen (PSA) blood tests as part of routine cancer screening. Talk to your health care provider about prostate cancer screening.  Testicular cancer screening is not recommended for adolescents or adult males who have no symptoms. Screening includes self-exam, a health care provider exam, and other screening tests. Consult with your health care provider about any symptoms you have or any concerns you have about testicular cancer.  Practice safe sex. Use condoms and avoid high-risk sexual practices to reduce the spread of sexually transmitted infections (STIs).  You should be screened for STIs, including gonorrhea and chlamydia if:  You are sexually active and are younger than 24 years.  You are older than 24 years, and your health care provider tells you that you are at risk for this type of infection.  Your sexual activity has changed since you were last screened, and you are at an increased risk for chlamydia or gonorrhea. Ask your health care provider if you are at risk.  If you are at risk of being infected with HIV, it is recommended that you take a prescription medicine daily to prevent HIV infection. This is  called pre-exposure prophylaxis (PrEP). You are considered at risk if:  You are a man who has sex with other men (MSM).  You are a heterosexual man who is sexually active with multiple partners.  You take drugs by injection.  You are sexually active with a partner who has HIV.  Talk with your health care provider about whether you are at high risk of being infected with HIV. If you choose to begin PrEP, you should first be tested for HIV. You should then be tested every 3 months for as long as you are taking PrEP.  Use sunscreen. Apply sunscreen liberally and repeatedly throughout the day. You should seek shade when your shadow is shorter than you. Protect yourself by wearing long sleeves, pants, a wide-brimmed hat, and sunglasses year round whenever you are outdoors.  Tell your health care provider of new moles or changes in moles, especially if there is a change in shape or color. Also, tell your health care provider if a mole is larger than the size of a pencil eraser.  A one-time screening for abdominal aortic aneurysm (AAA) and surgical repair of large AAAs by ultrasound is recommended for men aged 65-75 years who are current or former smokers.  Stay current with your vaccines (immunizations). Document Released: 03/29/2008 Document Revised: 10/06/2013 Document Reviewed: 02/26/2011 [employer] Patient Information 2015 [employer]. This information is not intended to replace advice given to you by your health care provider. Make sure you discuss any questions you have with your health care provider.  

## 2014-07-19 NOTE — Progress Notes (Signed)
Subjective:    Patient ID: Troy Moore, male    DOB: 1946/06/20, 68 y.o.   MRN: [id number]  Diabetes He presents for his follow-up diabetic visit. He has type 2 diabetes mellitus. Pertinent negatives for hypoglycemia include no confusion, dizziness, nervousness/anxiousness or tremors. Associated symptoms include polydipsia and polyuria. Pertinent negatives for diabetes include no blurred vision, no chest pain, no fatigue, no foot paresthesias, no foot ulcerations, no polyphagia, no visual change, no weakness and no weight loss. There are no hypoglycemic complications. Symptoms are stable. Diabetic complications include heart disease and nephropathy. Current diabetic treatment includes oral agent (triple therapy). He is compliant with treatment all of the time. His weight is stable. He is following a generally healthy diet. Meal planning includes avoidance of concentrated sweets. He participates in exercise intermittently. There is no change in his home blood glucose trend. An ACE inhibitor/angiotensin II receptor blocker is contraindicated. He does not see a podiatrist.Eye exam is current.      Review of Systems  Constitutional: Negative.  Negative for fever, chills, weight loss, diaphoresis, activity change, appetite change, fatigue and unexpected weight change.  HENT: Negative.  Negative for nosebleeds.   Eyes: Negative.  Negative for blurred vision.  Respiratory: Negative.  Negative for apnea, cough, choking, chest tightness, shortness of breath, wheezing and stridor.   Cardiovascular: Negative.  Negative for chest pain, palpitations and leg swelling.  Gastrointestinal: Positive for constipation. Negative for nausea, vomiting, abdominal pain, diarrhea and blood in stool.  Endocrine: Positive for polydipsia and polyuria. Negative for polyphagia.  Genitourinary: Negative.  Negative for urgency, hematuria, flank pain, decreased urine volume and difficulty urinating.  Musculoskeletal:  Negative.  Negative for arthralgias, back pain, gait problem, joint swelling, myalgias, neck pain and neck stiffness.  Skin: Negative.  Negative for rash.  Allergic/Immunologic: Negative.   Neurological: Negative.  Negative for dizziness, tremors, weakness, light-headedness and numbness.  Hematological: Negative.  Negative for adenopathy. Does not bruise/bleed easily.  Psychiatric/Behavioral: Positive for sleep disturbance (DFA and FA). Negative for suicidal ideas, hallucinations, behavioral problems, confusion, self-injury, dysphoric mood, decreased concentration and agitation. The patient is not nervous/anxious and is not hyperactive.        Objective:   Physical Exam  Vitals reviewed. Constitutional: He is oriented to person, place, and time. He appears well-developed and well-nourished. No distress.  HENT:  Head: Normocephalic and atraumatic.  Mouth/Throat: Oropharynx is clear and moist. No oropharyngeal exudate.  Eyes: Conjunctivae are normal. Right eye exhibits no discharge. Left eye exhibits no discharge. No scleral icterus.  Neck: Normal range of motion. Neck supple. No JVD present. No tracheal deviation present. No thyromegaly present.  Cardiovascular: Normal rate, regular rhythm, S1 normal, S2 normal and intact distal pulses.  Exam reveals no gallop, no S3, no S4 and no friction rub.   Murmur heard.  Systolic murmur is present with a grade of 1/6   No diastolic murmur is present  Pulses:      Carotid pulses are 1+ on the right side, and 1+ on the left side.      Radial pulses are 1+ on the right side, and 1+ on the left side.       Femoral pulses are 1+ on the right side, and 1+ on the left side.      Popliteal pulses are 1+ on the right side, and 1+ on the left side.       Dorsalis pedis pulses are 1+ on the right side, and 1+ on the left  side.       Posterior tibial pulses are 1+ on the right side, and 1+ on the left side.  +valvular click  Pulmonary/Chest: Effort normal and  breath sounds normal. No stridor. No respiratory distress. He has no wheezes. He has no rales. He exhibits no tenderness.  Abdominal: Soft. Bowel sounds are normal. He exhibits no distension and no mass. There is no tenderness. There is no rebound and no guarding. Hernia confirmed negative in the right inguinal area and confirmed negative in the left inguinal area.  Genitourinary: Rectum normal, prostate normal, testes normal and penis normal. Rectal exam shows no external hemorrhoid, no internal hemorrhoid, no fissure, no mass, no tenderness and anal tone normal. Guaiac negative stool. Prostate is not enlarged and not tender. Right testis shows no mass, no swelling and no tenderness. Right testis is descended. Left testis shows no mass, no swelling and no tenderness. Left testis is descended. Circumcised. No penile erythema or penile tenderness. No discharge found.  Musculoskeletal: Normal range of motion. He exhibits no edema and no tenderness.  Lymphadenopathy:    He has no cervical adenopathy.       Right: No inguinal adenopathy present.       Left: No inguinal adenopathy present.  Neurological: He is oriented to person, place, and time.  Skin: Skin is warm and dry. No rash noted. He is not diaphoretic. No erythema. No pallor.  Psychiatric: He has a normal mood and affect. His behavior is normal. Judgment and thought content normal.     Lab Results  Component Value Date   WBC 5.9 04/27/2014   HGB 11.7* 04/27/2014   HCT 33.9* 04/27/2014   PLT 150.0 04/27/2014   GLUCOSE 196* 05/20/2013   CHOL 122 03/26/2014   TRIG 225.0* 03/26/2014   HDL 31.80* 03/26/2014   LDLDIRECT 54.7 06/11/2012   LDLCALC 45 03/26/2014   ALT 20 03/26/2014   AST 24 03/26/2014   NA 140 05/20/2013   K 4.2 05/20/2013   CL 104 05/20/2013   CREATININE 1.3 05/20/2013   BUN 17 05/20/2013   CO2 29 05/20/2013   TSH 2.12 06/11/2012   PSA 0.51 03/26/2014   INR 1.1* 04/27/2014   HGBA1C 9.0* 03/26/2014        Assessment & Plan:

## 2014-07-19 NOTE — Progress Notes (Signed)
Pre visit review using our clinic review tool, if applicable. No additional management support is needed unless otherwise documented below in the visit note. 

## 2014-07-20 ENCOUNTER — Telehealth: Payer: Self-pay | Admitting: Internal Medicine

## 2014-07-20 ENCOUNTER — Encounter: Payer: Self-pay | Admitting: Internal Medicine

## 2014-07-20 LAB — C-PEPTIDE: C PEPTIDE: 2.38 ng/mL (ref 0.80–3.90)

## 2014-07-20 NOTE — Assessment & Plan Note (Signed)
He has achieved his LDL goal 

## 2014-07-20 NOTE — Assessment & Plan Note (Signed)
B12 is low, other vitamin levels are normal Will start B12 replacement therapy

## 2014-07-20 NOTE — Assessment & Plan Note (Signed)
His BP is well controlled Lytes and renal function are stable 

## 2014-07-20 NOTE — Assessment & Plan Note (Signed)

## 2014-07-20 NOTE — Assessment & Plan Note (Signed)
His blood sugars are too high and he had poly's and a relatively C-peptide I have asked him to start once daily basal insulin Will cont the oral meds for now

## 2014-07-20 NOTE — Assessment & Plan Note (Signed)
He will try belsomra for this

## 2014-07-20 NOTE — Telephone Encounter (Signed)
emmi emailed °

## 2014-07-20 NOTE — Assessment & Plan Note (Signed)
Start B12 parenteral replacement for this

## 2014-08-04 ENCOUNTER — Ambulatory Visit: Payer: Medicare Other | Admitting: Internal Medicine

## 2014-08-05 ENCOUNTER — Other Ambulatory Visit: Payer: Self-pay

## 2014-08-05 MED ORDER — FUROSEMIDE 40 MG PO TABS: ORAL_TABLET | ORAL | Status: DC

## 2014-08-20 LAB — HM DIABETES EYE EXAM

## 2014-08-24 ENCOUNTER — Telehealth: Payer: Self-pay | Admitting: *Deleted

## 2014-08-24 NOTE — Telephone Encounter (Signed)
Patient stated that he has a dental appointment on Monday and needs an antibiotic sent to [facility] in [place]. Please advise. Thanks, MI

## 2014-08-27 ENCOUNTER — Other Ambulatory Visit: Payer: Self-pay

## 2014-08-27 ENCOUNTER — Other Ambulatory Visit: Payer: Self-pay | Admitting: Internal Medicine

## 2014-08-27 ENCOUNTER — Telehealth: Payer: Self-pay | Admitting: *Deleted

## 2014-08-27 MED ORDER — AMOXICILLIN 500 MG PO TABS
ORAL_TABLET | ORAL | Status: DC
Start: 2014-08-27 — End: 2015-04-28

## 2014-08-27 NOTE — Telephone Encounter (Signed)
Pharmacist Altamese Dilling left msg on triage stating calling concerning pt warfarin. The manufacturer has change their formation, but is still using same manufacturer we are require to notified md to see if ok to change...Johny Chess

## 2014-08-27 NOTE — Telephone Encounter (Signed)
Ok with me 

## 2014-08-27 NOTE — Telephone Encounter (Signed)
Notified Marc with md response...Johny Chess

## 2014-08-27 NOTE — Telephone Encounter (Signed)
Order amoxil 500 mg  Take 4 tablets 4 hrs before procedure Per DR Mare Ferrari (DOD)

## 2014-08-28 NOTE — Telephone Encounter (Signed)
Spoke to pt about diabetic eye exam.   Completed at [facility] this year per pt. He is going back on Monday and will request that they send [place] a copy.  Emailed pt the information for the office and will follow up if nothing is sent to [place].

## 2014-08-31 ENCOUNTER — Encounter: Payer: Self-pay | Admitting: Internal Medicine

## 2014-09-07 ENCOUNTER — Telehealth: Payer: Self-pay | Admitting: Cardiology

## 2014-09-07 NOTE — Telephone Encounter (Signed)
Please call,concerning his clarence . She says she have faxed this twice.

## 2014-09-08 NOTE — Telephone Encounter (Signed)
I have called and left messages twice for the clearance to be faxed to me again

## 2014-09-08 NOTE — Telephone Encounter (Signed)
Left vm for pt to call back about eye exam.

## 2014-09-11 ENCOUNTER — Telehealth: Payer: Self-pay | Admitting: Cardiology

## 2014-09-11 NOTE — Telephone Encounter (Signed)
I received a request for clearance for endoscopy.  I cannot tell whether this is for upper or lower.  The patient would be cleared for endoscopy.  He can hold his warfarin without bridging according to current guidelines if it is necessary for the procedure.  He should resume per the GI MD.  Mr. Cobern watches his own warfarin.

## 2014-09-17 NOTE — Telephone Encounter (Signed)
Pt. Stated he was having his 5 year colonoscopy , probably with Dr. Fuller Plan who has performed these on this pt. In the past. Great thing about that Dr. Fuller Plan is on [facility] and can see this note, pt. informed

## 2014-09-21 ENCOUNTER — Other Ambulatory Visit: Payer: Self-pay

## 2014-09-21 MED ORDER — GLIPIZIDE 10 MG PO TABS: ORAL_TABLET | ORAL | Status: AC

## 2014-10-01 ENCOUNTER — Telehealth: Payer: Self-pay | Admitting: Cardiology

## 2014-10-01 NOTE — Telephone Encounter (Signed)
Pt need a clarence for a colonoscopy. Please fax to [facility] -[phone].

## 2014-10-05 NOTE — Telephone Encounter (Signed)
Pt. Needs clearance for colonoscopy

## 2014-10-27 ENCOUNTER — Telehealth: Payer: Self-pay | Admitting: Cardiology

## 2014-10-27 NOTE — Telephone Encounter (Signed)
Returned call to patient he stated he will be having a colonoscopy at [place].Stated he wanted to ask Dr.Hochrein how long to hold coumadin and will need Dr.Hochrein's approval faxed to [facility] at Fax # [phone].Message sent to [facility].

## 2014-10-27 NOTE — Telephone Encounter (Signed)
New Message      Patient needs a clearance for a procedure that he needs done. He is having a colonoscopy and needs clearance.  Please call patient

## 2014-10-29 ENCOUNTER — Encounter: Payer: Self-pay | Admitting: *Deleted

## 2014-10-29 NOTE — Telephone Encounter (Signed)
Spoke with patient and informed him of medication instructions per Dr. Percival Spanish surround the time of his colonscopy. He voiced understanding. Letter composed and faxed to Attn: Caryl Pina at the number denoted below.

## 2014-10-29 NOTE — Telephone Encounter (Signed)
He will need approval to the number below.  He will need to hold the warfarin 5 days prior to the procedure.  No bridging is indicated.

## 2014-11-02 ENCOUNTER — Ambulatory Visit (INDEPENDENT_AMBULATORY_CARE_PROVIDER_SITE_OTHER): Payer: 59 | Admitting: Internal Medicine

## 2014-11-02 ENCOUNTER — Telehealth: Payer: Self-pay | Admitting: *Deleted

## 2014-11-02 ENCOUNTER — Encounter: Payer: Self-pay | Admitting: Internal Medicine

## 2014-11-02 ENCOUNTER — Telehealth: Payer: Self-pay | Admitting: Internal Medicine

## 2014-11-02 VITALS — BP 160/80 | HR 83 | Temp 98.6°F | Ht 71.0 in | Wt 172.8 lb

## 2014-11-02 DIAGNOSIS — R42 Dizziness and giddiness: Secondary | ICD-10-CM

## 2014-11-02 DIAGNOSIS — Z7901 Long term (current) use of anticoagulants: Secondary | ICD-10-CM

## 2014-11-02 DIAGNOSIS — I10 Essential (primary) hypertension: Secondary | ICD-10-CM

## 2014-11-02 DIAGNOSIS — E118 Type 2 diabetes mellitus with unspecified complications: Secondary | ICD-10-CM

## 2014-11-02 DIAGNOSIS — D519 Vitamin B12 deficiency anemia, unspecified: Secondary | ICD-10-CM

## 2014-11-02 NOTE — Telephone Encounter (Signed)
Patient Name: Troy Moore  DOB: 1946-04-06    Initial Comment Caller states c/o dizziness, diarrhea, belching   Nurse Assessment  Nurse: Mallie Mussel, RN, Alveta Heimlich Date/Time (Eastern Time): 11/02/2014 10:32:57 AM  Confirm and document reason for call. If symptomatic, describe symptoms. ---Caller states that he has dizziness, diarrhea and belching. His diarrhea began last night along with dizziness. Had diarrhea x 4. His dizziness is not too bad. Denies room spinning. He has to grab onto items to walk at times, its not constant. He last urinated 45 minutes ago. Denies fever. He is a diabetic, but he does not check his readings.  Has the patient traveled out of the country within the last 30 days? ---No  Does the patient require triage? ---Yes  Related visit to physician within the last 2 weeks? ---No  Does the PT have any chronic conditions? (i.e. diabetes, asthma, etc.) ---Yes  List chronic conditions. ---Diabetes, HTN, Heart Valve Replacement     Guidelines    Guideline Title Affirmed Question Affirmed Notes  Dizziness - Lightheadedness [1] MODERATE dizziness (e.g., interferes with normal activities) AND [2] has NOT been evaluated by physician for this (Exception: dizziness caused by heat exposure, sudden standing, or poor fluid intake)    Final Disposition User   See Physician within [place], Therapist, sports, Alveta Heimlich

## 2014-11-02 NOTE — Assessment & Plan Note (Signed)
Blood pressure goals reviewed. BMET 

## 2014-11-02 NOTE — Progress Notes (Signed)
   Subjective:    Patient ID: Troy Moore, male    DOB: [birth year], 69 y.o.   MRN: [id number]  HPI  Symptoms began last night at 11:30 PM when he tried to rise from a chair. He became markedly dizzy and had to sit back down. He lay on the couch for the night and the symptoms persisted. There appeared to be no difference whether he changed position on the couch. Symptoms finally resolved by noon today  He also had some stomach cramps followed by loose stool. He did have some regurgitation without vomiting. He denied nausea  With the lightheadedness he had chills and sweats  He had been constipated for 6 weeks & had been taking stool softeners. He had been having bowel movements every 3-4 days. He does have an upcoming colonoscopy 11/29/14  There was no cardiac or neurologic prodrome prior to the symptoms  He has no associated upper respiratory tract infection symptoms  There's been no clay colored stool or Coke-colored urine.  He did take the flu shot  He is not monitoring glucoses at home.  No blood pressure meds taken today  Last labs were October 2015. GFR was 59.57. B12 level was low normal at 219. Hemoglobin 12.3/hematocrit 35.8. A1c revealed uncontrolled diabetes with an A1c of 8.8%.  He has a history of B12 deficiency  No PT/INR "for months".    Review of Systems  Denied were any change in heart rhythm or rate prior to the event. There was no associated chest pain or shortness of breath .  Also specifically denied prior to the episode were headache, limb weakness, tingling, or numbness. No seizure activity noted.     Objective:   Physical Exam Pertinent or positive findings include: He appears markedly fatigued. Appears younger than stated age He has bilateral ptosis There is a very loud mechanical heart valve sound.  General appearance :adequately nourished; in no distress. Eyes: No conjunctival inflammation or scleral icterus is present. Oral exam: Dental  hygiene is good. Lips and gums are healthy appearing.There is no oropharyngeal erythema or exudate noted.  Heart:  Normal rate and regular rhythm. No gallop, murmur, click, rub or other extra sounds Lungs:Chest clear to auscultation; no wheezes, rhonchi,rales ,or rubs present.No increased work of breathing.  Abdomen: bowel sounds normal, soft and non-tender without masses, organomegaly or hernias noted.  No guarding or rebound. No flank tenderness to percussion. Vascular : all pulses equal ; no bruits present. Skin:Warm & dry.  Intact without suspicious lesions or rashes ; no jaundice or tenting Lymphatic: No lymphadenopathy is noted about the head, neck, axilla          Assessment & Plan:  #1 postural lightheadedness; no definite benign positional vertigo symptoms #2 see problem list for active diagnoses which will be evaluated with updated labs

## 2014-11-02 NOTE — Assessment & Plan Note (Signed)
CBC B12 level & methylmalonic acid level

## 2014-11-02 NOTE — Telephone Encounter (Signed)
[facility] - Client [facility] Patient Name: Troy Moore Gender: Male DOB: 01/16/1942 Age: 69 Y 9 M 16 D Return Phone Number: [phone] (Primary) Address: [place] City/State/Zip: [place] [address] Client [facility] Night - Client Client Site [facility] - Night Physician [place], [place] Type Call Call Type Triage / Clinical Relationship To Patient Self Return Phone Number [phone] (Primary) Chief Complaint Arm Pain Initial Comment Caller states has been aching across shoulders, chills, lower back pain and vomiting the other day. PreDisposition Call Doctor Nurse Assessment Nurse: Larita Fife Date/Time Eilene Ghazi Time): 11/02/2014 7:15:25 AM Confirm and document reason for call. If symptomatic, describe symptoms. ---Caller states has been aching across shoulders, chills, lower back pain across her hip , that started about 2 weeks and vomiting the other day. States that she has not been sleeping well but states that she has had for over 2 weeks with "excruciating" shoulder pain. States that she has figured "out why this is" states that her sister had a heart attack". Last time she vomited was last Thursday. Has the patient traveled out of the country within the last 30 days? ---No Does the patient require triage? ---Yes Related visit to physician within the last 2 weeks? ---No Does the PT have any chronic conditions? (i.e. diabetes, asthma, etc.) ---No Guidelines Guideline Title Affirmed Question Affirmed Notes Nurse Date/Time Eilene Ghazi Time) Shoulder Pain [1] Age > 40 AND [2] no obvious cause AND [3] pain even when not moving the arm (Exception: pain is clearly made worse by moving arm or bending neck) States that she is "sitting" right now and her "shoulders are hurting". States once again that they are hurting "excruitating". Larita Fife  11/02/2014 7:21:15 AM Disp. Time Eilene Ghazi Time) Disposition Final User 11/02/2014 7:29:34 AM Go to ED Now Yes Larita Fife PLEASE NOTE: All timestamps contained within this report are represented as [place] Standard Time. CONFIDENTIALTY NOTICE: This fax transmission is intended only for the addressee. It contains information that is legally privileged, confidential or otherwise protected from use or disclosure. If you are not the intended recipient, you are strictly prohibited from reviewing, disclosing, copying using or disseminating any of this information or taking any action in reliance on or regarding this information. If you have received this fax in error, please notify us immediately by telephone so that we can arrange for its return to [place]. Phone: [phone], Toll-Free: [phone], Fax: [phone] Page: 2 of 2 Call Id: [id number] Caller Understands: Yes Disagree/Comply: Comply Care Advice Given Per Guideline GO TO ED NOW: You need to be seen in the Emergency Department. Go to the ER at ___________ [facility] now. Drive carefully. NOTE TO TRIAGER - DRIVING: * Another adult should drive. * If immediate transportation is not available via car or taxi, then the patient should be instructed to call [id number]. CALL EMS IF: The patient passes out, starts acting confused or becomes to weak to stand. BRING MEDICINES: * Please bring a list of your current medicines when you go to the Emergency Department (ER). CARE ADVICE given per Shoulder Pain (Adult) guideline After Care Instructions Given Call Event Type User Date / Time Description Comments User: Larita Fife Date/Time ([place] Time): 11/02/2014 7:23:13 AM Caller is talking and talking and going from symptom to symptom, from feet swelling, to shoulder pain, to back pain. User: Larita Fife Date/Time (Eastern Time): 11/02/2014 7:30:53 AM Caller declined ER outcome, just kept talking and  talking and c/o of another symptom about being  cold. States that she is going to drive to her PCP doctors instead of going to the ER. Referrals GO TO FACILITY REFUSED

## 2014-11-02 NOTE — Assessment & Plan Note (Signed)
A1c , urine microalbumin, BMET 

## 2014-11-02 NOTE — Patient Instructions (Addendum)
Stay on clear liquids for 48-72 hours or until bowels are normal.This would include  jello, sherbert (NOT ice cream), Lipton's chicken noodle soup(NOT cream based soups),Gatorade Lite, flat Ginger ale (without High Fructose Corn Syrup),dry toast or crackers, baked potato.No milk , dairy or grease until bowels are formed. Align OR Florastor , a [employer] , daily if stools are loose. Immodium AD for frankly watery stool. Report increasing pain, fever or rectal bleeding Fasting labs in am; orders entered.  Perform isometric exercise of calves  ( while seated go up on toes to count of 5 & then onto heels for 5 count). Repeat  4- 5 times prior to standing if you've been seated or supine for any significant period of time as BP drops with such positions.   Minimal Blood Pressure Goal= AVERAGE < 140/90;  Ideal is an AVERAGE < 135/85. This AVERAGE should be calculated from @ least 5-7 BP readings taken @ different times of day on different days of week. You should not respond to isolated BP readings , but rather the AVERAGE for that week .Please bring your  blood pressure cuff to office visits to verify that it is reliable.It  can also be checked against the blood pressure device at the pharmacy. Finger or wrist cuffs are not dependable; an arm cuff is.

## 2014-11-02 NOTE — Progress Notes (Signed)
Pre visit review using our clinic review tool, if applicable. No additional management support is needed unless otherwise documented below in the visit note. 

## 2014-11-03 ENCOUNTER — Other Ambulatory Visit (INDEPENDENT_AMBULATORY_CARE_PROVIDER_SITE_OTHER): Payer: 59

## 2014-11-03 DIAGNOSIS — Z7901 Long term (current) use of anticoagulants: Secondary | ICD-10-CM

## 2014-11-03 DIAGNOSIS — E118 Type 2 diabetes mellitus with unspecified complications: Secondary | ICD-10-CM

## 2014-11-03 DIAGNOSIS — R42 Dizziness and giddiness: Secondary | ICD-10-CM

## 2014-11-03 DIAGNOSIS — D519 Vitamin B12 deficiency anemia, unspecified: Secondary | ICD-10-CM

## 2014-11-03 LAB — BASIC METABOLIC PANEL
BUN: 18 mg/dL (ref 6–23)
CALCIUM: 9.9 mg/dL (ref 8.4–10.5)
CO2: 32 mEq/L (ref 19–32)
Chloride: 99 mEq/L (ref 96–112)
Creatinine, Ser: 1.36 mg/dL (ref 0.40–1.50)
GFR: 55 mL/min — ABNORMAL LOW (ref >60.00)
GLUCOSE: 399 mg/dL — AB (ref 70–99)
Potassium: 3.9 mEq/L (ref 3.5–5.1)
Sodium: 137 mEq/L (ref 135–145)

## 2014-11-03 LAB — CBC WITH DIFFERENTIAL/PLATELET
BASOS ABS: 0 10*3/uL (ref 0.0–0.1)
Basophils Relative: 0.3 % (ref 0.0–3.0)
Eosinophils Absolute: 0.1 10*3/uL (ref 0.0–0.7)
Eosinophils Relative: 2.5 % (ref 0.0–5.0)
HCT: 36.2 % — ABNORMAL LOW (ref 39.0–52.0)
HEMOGLOBIN: 12.7 g/dL — AB (ref 13.0–17.0)
LYMPHS PCT: 16 % (ref 12.0–46.0)
Lymphs Abs: 0.9 10*3/uL (ref 0.7–4.0)
MCHC: 35.2 g/dL (ref 30.0–36.0)
MCV: 86.7 fl (ref 78.0–100.0)
Monocytes Absolute: 0.4 10*3/uL (ref 0.1–1.0)
Monocytes Relative: 6.8 % (ref 3.0–12.0)
NEUTROS PCT: 74.4 % (ref 43.0–77.0)
Neutro Abs: 4.2 10*3/uL (ref 1.4–7.7)
Platelets: 173 10*3/uL (ref 150.0–400.0)
RBC: 4.18 Mil/uL — AB (ref 4.22–5.81)
RDW: 13.2 % (ref 11.5–15.5)
WBC: 5.6 10*3/uL (ref 4.0–10.5)

## 2014-11-03 LAB — PROTIME-INR
INR: 1.3 ratio — ABNORMAL HIGH (ref 0.8–1.0)
Prothrombin Time: 14.5 s — ABNORMAL HIGH (ref 9.6–13.1)

## 2014-11-03 LAB — HEMOGLOBIN A1C: Hgb A1c MFr Bld: 9.8 % — ABNORMAL HIGH (ref 4.6–6.5)

## 2014-11-03 LAB — MICROALBUMIN / CREATININE URINE RATIO
Creatinine,U: 95.5 mg/dL
MICROALB/CREAT RATIO: 97.5 mg/g — AB (ref 0.0–30.0)
Microalb, Ur: 93.1 mg/dL — ABNORMAL HIGH (ref 0.0–1.9)

## 2014-11-05 ENCOUNTER — Other Ambulatory Visit: Payer: Self-pay | Admitting: Internal Medicine

## 2014-11-05 DIAGNOSIS — E1365 Other specified diabetes mellitus with hyperglycemia: Principal | ICD-10-CM

## 2014-11-05 DIAGNOSIS — Z7901 Long term (current) use of anticoagulants: Secondary | ICD-10-CM

## 2014-11-05 DIAGNOSIS — E1351 Other specified diabetes mellitus with diabetic peripheral angiopathy without gangrene: Secondary | ICD-10-CM

## 2014-11-05 DIAGNOSIS — IMO0002 Reserved for concepts with insufficient information to code with codable children: Secondary | ICD-10-CM

## 2014-11-05 LAB — METHYLMALONIC ACID, SERUM: METHYLMALONIC ACID, QUANT: 142 nmol/L (ref 87–318)

## 2014-11-08 ENCOUNTER — Encounter: Payer: Self-pay | Admitting: Internal Medicine

## 2014-11-08 ENCOUNTER — Telehealth: Payer: Self-pay | Admitting: Cardiology

## 2014-11-08 NOTE — Telephone Encounter (Signed)
I noted from Dr. Linna Darner that the patients INR was not therapeutic at the most recent labs.  He has been regulating his own INR for years.  He says that he will check it this week.  If he has trouble getting to a therapeutic level he will call me and we will set him up in our warfarin clinic.  He lives in [place] so travel is somewhat difficult.  He will also schedule to see me.  He understands the risk of not having therapeutic INRs.

## 2014-11-22 ENCOUNTER — Other Ambulatory Visit: Payer: Self-pay

## 2014-11-22 MED ORDER — SAXAGLIPTIN HCL 5 MG PO TABS: ORAL_TABLET | ORAL | Status: AC

## 2014-11-30 ENCOUNTER — Other Ambulatory Visit: Payer: Self-pay

## 2014-11-30 MED ORDER — METOPROLOL TARTRATE 50 MG PO TABS: 50.0000 mg | ORAL_TABLET | Freq: Two times a day (BID) | ORAL | Status: DC

## 2014-12-08 ENCOUNTER — Telehealth: Payer: Self-pay | Admitting: Cardiology

## 2014-12-08 NOTE — Telephone Encounter (Signed)
New Msg        Pt calling about prescription for test strips for INR.   Where does he get them and can prescription be filled?

## 2014-12-10 NOTE — Telephone Encounter (Signed)
Called pt. And he got his Inratio INR machine from a friend off his and he has never had it checked my any medical staff, I told the pt. The best i could do is print a script and he would have to get the strips himself , that's if Dr. Percival Spanish would agree to it, pt. Agreed with plan

## 2014-12-14 NOTE — Telephone Encounter (Signed)
Pt. Called and informed that we could not write a script for a machine or be involved in his home monitoring in any way, that his only option would be to let us monitor his INR or he would have to seek assistance from his PCP

## 2014-12-16 ENCOUNTER — Other Ambulatory Visit: Payer: Self-pay | Admitting: Internal Medicine

## 2014-12-17 ENCOUNTER — Other Ambulatory Visit: Payer: Self-pay | Admitting: Internal Medicine

## 2015-01-10 ENCOUNTER — Encounter: Payer: Self-pay | Admitting: Gastroenterology

## 2015-01-11 ENCOUNTER — Encounter: Payer: Self-pay | Admitting: Gastroenterology

## 2015-02-10 ENCOUNTER — Other Ambulatory Visit: Payer: Self-pay | Admitting: Internal Medicine

## 2015-02-14 ENCOUNTER — Other Ambulatory Visit: Payer: Self-pay | Admitting: Internal Medicine

## 2015-02-20 ENCOUNTER — Other Ambulatory Visit: Payer: Self-pay | Admitting: Internal Medicine

## 2015-02-25 ENCOUNTER — Other Ambulatory Visit: Payer: Self-pay

## 2015-02-25 MED ORDER — AMLODIPINE BESYLATE 10 MG PO TABS: ORAL_TABLET | ORAL | Status: DC

## 2015-04-11 ENCOUNTER — Telehealth: Payer: Self-pay | Admitting: *Deleted

## 2015-04-11 NOTE — Telephone Encounter (Signed)
Left message for patient to schedule f/u appt and a1c

## 2015-04-13 ENCOUNTER — Telehealth: Payer: Self-pay | Admitting: Internal Medicine

## 2015-04-13 ENCOUNTER — Other Ambulatory Visit: Payer: Self-pay | Admitting: Internal Medicine

## 2015-04-13 DIAGNOSIS — I2510 Atherosclerotic heart disease of native coronary artery without angina pectoris: Secondary | ICD-10-CM

## 2015-04-13 NOTE — Telephone Encounter (Signed)
Patient called in and is needing a referral from you to see cardiologist Dr. Efrain Sella at [facility]. Their phone number is [phone]

## 2015-04-21 ENCOUNTER — Other Ambulatory Visit: Payer: Self-pay

## 2015-04-21 NOTE — Telephone Encounter (Signed)
done

## 2015-04-28 ENCOUNTER — Ambulatory Visit: Payer: 59 | Admitting: Internal Medicine

## 2015-04-28 ENCOUNTER — Other Ambulatory Visit (INDEPENDENT_AMBULATORY_CARE_PROVIDER_SITE_OTHER): Payer: Medicare Other

## 2015-04-28 ENCOUNTER — Encounter: Payer: Self-pay | Admitting: Internal Medicine

## 2015-04-28 ENCOUNTER — Ambulatory Visit (INDEPENDENT_AMBULATORY_CARE_PROVIDER_SITE_OTHER)
Admission: RE | Admit: 2015-04-28 | Discharge: 2015-04-28 | Disposition: A | Discharge status: Home / Self Care | Payer: Medicare Other | Source: Ambulatory Visit | Attending: Internal Medicine | Admitting: Internal Medicine

## 2015-04-28 ENCOUNTER — Ambulatory Visit (INDEPENDENT_AMBULATORY_CARE_PROVIDER_SITE_OTHER): Payer: Medicare Other | Admitting: Internal Medicine

## 2015-04-28 VITALS — BP 126/64 | HR 69 | Temp 98.2°F | Resp 16 | Ht 71.0 in | Wt 183.0 lb

## 2015-04-28 DIAGNOSIS — E785 Hyperlipidemia, unspecified: Secondary | ICD-10-CM

## 2015-04-28 DIAGNOSIS — R05 Cough: Secondary | ICD-10-CM

## 2015-04-28 DIAGNOSIS — D519 Vitamin B12 deficiency anemia, unspecified: Secondary | ICD-10-CM

## 2015-04-28 DIAGNOSIS — E1351 Other specified diabetes mellitus with diabetic peripheral angiopathy without gangrene: Secondary | ICD-10-CM

## 2015-04-28 DIAGNOSIS — R059 Cough, unspecified: Secondary | ICD-10-CM

## 2015-04-28 DIAGNOSIS — D510 Vitamin B12 deficiency anemia due to intrinsic factor deficiency: Secondary | ICD-10-CM

## 2015-04-28 DIAGNOSIS — N183 Chronic kidney disease, stage 3 unspecified: Secondary | ICD-10-CM

## 2015-04-28 DIAGNOSIS — I10 Essential (primary) hypertension: Secondary | ICD-10-CM | POA: Diagnosis not present

## 2015-04-28 DIAGNOSIS — IMO0002 Reserved for concepts with insufficient information to code with codable children: Secondary | ICD-10-CM

## 2015-04-28 DIAGNOSIS — E1365 Other specified diabetes mellitus with hyperglycemia: Secondary | ICD-10-CM

## 2015-04-28 LAB — CBC WITH DIFFERENTIAL/PLATELET
Basophils Absolute: 0 10*3/uL (ref 0.0–0.1)
Basophils Relative: 0.4 % (ref 0.0–3.0)
EOS ABS: 0.2 10*3/uL (ref 0.0–0.7)
EOS PCT: 5.6 % — AB (ref 0.0–5.0)
HCT: 34.4 % — ABNORMAL LOW (ref 39.0–52.0)
Hemoglobin: 11.9 g/dL — ABNORMAL LOW (ref 13.0–17.0)
LYMPHS ABS: 0.8 10*3/uL (ref 0.7–4.0)
Lymphocytes Relative: 19.9 % (ref 12.0–46.0)
MCHC: 34.7 g/dL (ref 30.0–36.0)
MCV: 86.7 fl (ref 78.0–100.0)
MONO ABS: 0.3 10*3/uL (ref 0.1–1.0)
Monocytes Relative: 7.8 % (ref 3.0–12.0)
Neutro Abs: 2.8 10*3/uL (ref 1.4–7.7)
Neutrophils Relative %: 66.3 % (ref 43.0–77.0)
Platelets: 196 10*3/uL (ref 150.0–400.0)
RBC: 3.97 Mil/uL — AB (ref 4.22–5.81)
RDW: 13.9 % (ref 11.5–15.5)
WBC: 4.3 10*3/uL (ref 4.0–10.5)

## 2015-04-28 LAB — BASIC METABOLIC PANEL
BUN: 20 mg/dL (ref 6–23)
CO2: 33 meq/L — AB (ref 19–32)
Calcium: 10.3 mg/dL (ref 8.4–10.5)
Chloride: 103 mEq/L (ref 96–112)
Creatinine, Ser: 1.58 mg/dL — ABNORMAL HIGH (ref 0.40–1.50)
GFR: 46.2 mL/min — ABNORMAL LOW (ref >60.00)
GLUCOSE: 118 mg/dL — AB (ref 70–99)
POTASSIUM: 3.7 meq/L (ref 3.5–5.1)
SODIUM: 142 meq/L (ref 135–145)

## 2015-04-28 LAB — LIPID PANEL
Cholesterol: 129 mg/dL (ref 0–200)
HDL: 29.7 mg/dL — AB (ref >39.00)
NonHDL: 99.3
TRIGLYCERIDES: 329 mg/dL — AB (ref 0.0–149.0)
Total CHOL/HDL Ratio: 4
VLDL: 65.8 mg/dL — ABNORMAL HIGH (ref 0.0–40.0)

## 2015-04-28 LAB — TSH: TSH: 2.02 u[IU]/mL (ref 0.35–4.50)

## 2015-04-28 LAB — HEMOGLOBIN A1C: Hgb A1c MFr Bld: 6.2 % (ref 4.6–6.5)

## 2015-04-28 MED ORDER — AMLODIPINE BESYLATE 10 MG PO TABS: ORAL_TABLET | ORAL | Status: AC

## 2015-04-28 MED ORDER — AMOXICILLIN 500 MG PO TABS: ORAL_TABLET | ORAL | Status: AC

## 2015-04-28 MED ORDER — PROMETHAZINE-CODEINE 6.25-10 MG/5ML PO SYRP: 5.0000 mL | ORAL_SOLUTION | ORAL | Status: AC | PRN

## 2015-04-28 NOTE — Progress Notes (Signed)
Pre visit review using our clinic review tool, if applicable. No additional management support is needed unless otherwise documented below in the visit note. 

## 2015-04-28 NOTE — Patient Instructions (Signed)
Cough, Adult  A cough is a reflex that helps clear your throat and airways. It can help heal the body or may be a reaction to an irritated airway. A cough may only last 2 or 3 weeks (acute) or may last more than 8 weeks (chronic).  CAUSES Acute cough:  Viral or bacterial infections. Chronic cough:  Infections.  Allergies.  Asthma.  Post-nasal drip.  Smoking.  Heartburn or acid reflux.  Some medicines.  Chronic lung problems (COPD).  Cancer. SYMPTOMS   Cough.  Fever.  Chest pain.  Increased breathing rate.  High-pitched whistling sound when breathing (wheezing).  Colored mucus that you cough up (sputum). TREATMENT   A bacterial cough may be treated with antibiotic medicine.  A viral cough must run its course and will not respond to antibiotics.  Your caregiver may recommend other treatments if you have a chronic cough. HOME CARE INSTRUCTIONS   Only take over-the-counter or prescription medicines for pain, discomfort, or fever as directed by your caregiver. Use cough suppressants only as directed by your caregiver.  Use a cold steam vaporizer or humidifier in your bedroom or home to help loosen secretions.  Sleep in a semi-upright position if your cough is worse at night.  Rest as needed.  Stop smoking if you smoke. SEEK IMMEDIATE MEDICAL CARE IF:   You have pus in your sputum.  Your cough starts to worsen.  You cannot control your cough with suppressants and are losing sleep.  You begin coughing up blood.  You have difficulty breathing.  You develop pain which is getting worse or is uncontrolled with medicine.  You have a fever. MAKE SURE YOU:   Understand these instructions.  Will watch your condition.  Will get help right away if you are not doing well or get worse. Document Released: 03/30/2011 Document Revised: 12/24/2011 Document Reviewed: 03/30/2011 ExitCare Patient Information 2015 [employer]. This information is not intended  to replace advice given to you by your health care provider. Make sure you discuss any questions you have with your health care provider.  

## 2015-04-29 ENCOUNTER — Encounter: Payer: Self-pay | Admitting: Internal Medicine

## 2015-04-29 LAB — FOLATE: Folate: >24.8 ng/mL (ref >5.9)

## 2015-04-29 LAB — VITAMIN B12: Vitamin B-12: 368 pg/mL (ref 211–911)

## 2015-04-29 LAB — LDL CHOLESTEROL, DIRECT: Direct LDL: 71 mg/dL

## 2015-05-01 NOTE — Progress Notes (Signed)
Subjective:  Patient ID: Troy Moore, male    DOB: May 10, 1946  Age: 69 y.o. MRN: [id number]  CC: Diabetes; Hyperlipidemia; Hypertension; and Cough   HPI Troy Moore presents for follow-up on medical problems but his main complaint today is coughing for 3 weeks. He says the cough started with a fever and chills but that has resolved. He states he coughs up clear phlegm.  Outpatient Prescriptions Prior to Visit  Medication Sig Dispense Refill  . brimonidine (ALPHAGAN) 0.2 % ophthalmic solution Place 1 drop into both eyes 2 (two) times daily.    . CRESTOR 10 MG tablet TAKE ONE TABLET BY MOUTH ONE TIME DAILY  90 tablet 3  . fenofibrate 160 MG tablet TAKE ONE TABLET BY MOUTH ONE TIME DAILY  90 tablet 3  . furosemide (LASIX) 40 MG tablet TAKE ONE TABLET BY MOUTH ONE TIME DAILY 90 tablet 1  . glipiZIDE (GLUCOTROL) 10 MG tablet TAKE HALF TABLET BY MOUTH TWICE DAILY 90 tablet 5  . Insulin Glargine (TOUJEO SOLOSTAR) 300 UNIT/ML SOPN Inject 30 Units into the skin daily. 1.5 mL 11  . latanoprost (XALATAN) 0.005 % ophthalmic solution 1 drop at bedtime.      . metFORMIN (GLUCOPHAGE) 1000 MG tablet Take 1 tablet (1,000 mg total) by mouth 2 (two) times daily. 180 tablet 1  . metoprolol (LOPRESSOR) 50 MG tablet Take 1 tablet (50 mg total) by mouth 2 (two) times daily. 180 tablet 3  . MULTIPLE VITAMIN PO Take by mouth.      . potassium chloride (K-DUR) 10 MEQ tablet Take one tablet by mouth twice daily. 180 tablet 0  . saxagliptin HCl (ONGLYZA) 5 MG TABS tablet Take one tablet by mouth one time daily 90 tablet 3  . Suvorexant (BELSOMRA) 15 MG TABS Take 1 tablet by mouth at bedtime as needed. 30 tablet 5  . vitamin C (ASCORBIC ACID) 500 MG tablet Take 500 mg by mouth 2 (two) times daily.      Marland Kitchen warfarin (COUMADIN) 7.5 MG tablet TAKE ONE TABLET BY MOUTH ONE TIME DAILY 90 tablet 2  . amLODipine (NORVASC) 10 MG tablet Take one tablet by mouth one time daily 90 tablet 3  . amoxicillin (AMOXIL) 500 MG  tablet Take 4 Tablets 1 hour before procedure 4 tablet 0  . promethazine-codeine (PHENERGAN WITH CODEINE) 6.25-10 MG/5ML syrup Take 5 mLs by mouth every 4 (four) hours as needed for cough. 240 mL 1   No facility-administered medications prior to visit.    ROS Review of Systems  Constitutional: Negative.  Negative for fever, chills, diaphoresis, activity change, appetite change, fatigue and unexpected weight change.  HENT: Negative.  Negative for trouble swallowing and voice change.   Eyes: Negative.   Respiratory: Positive for cough. Negative for apnea, choking, chest tightness, shortness of breath, wheezing and stridor.   Cardiovascular: Negative.  Negative for chest pain, palpitations and leg swelling.  Gastrointestinal: Negative.  Negative for nausea, vomiting, abdominal pain, diarrhea, constipation and blood in stool.  Endocrine: Negative.   Genitourinary: Negative.   Musculoskeletal: Negative.   Skin: Negative.  Negative for rash.  Allergic/Immunologic: Negative.   Neurological: Negative.  Negative for dizziness, tremors, syncope, light-headedness, numbness and headaches.  Hematological: Negative.   Psychiatric/Behavioral: Negative.     Objective:  BP 126/64 mmHg  Pulse 69  Temp(Src) 98.2 F (36.8 C) (Oral)  Resp 16  Ht 5\' 11"  (1.803 m)  Wt 183 lb (83.008 kg)  BMI 25.53 kg/m2  SpO2 98%  BP Readings from Last 3 Encounters:  04/28/15 126/64  11/02/14 160/80  07/19/14 138/68    Wt Readings from Last 3 Encounters:  04/28/15 183 lb (83.008 kg)  11/02/14 172 lb 12 oz (78.359 kg)  07/19/14 178 lb (80.74 kg)    Physical Exam  Constitutional: He is oriented to person, place, and time.  Non-toxic appearance. He does not have a sickly appearance. He does not appear ill. No distress.  HENT:  Mouth/Throat: Oropharynx is clear and moist. No oropharyngeal exudate.  Eyes: Conjunctivae are normal. Right eye exhibits no discharge. Left eye exhibits no discharge. No scleral  icterus.  Neck: Normal range of motion. Neck supple. No JVD present. No tracheal deviation present. No thyromegaly present.  Cardiovascular: Normal rate, regular rhythm and intact distal pulses.  Exam reveals no gallop and no friction rub.   Murmur heard.  Decrescendo systolic murmur is present with a grade of 1/6   No diastolic murmur is present  + syst click  Pulmonary/Chest: Effort normal and breath sounds normal. No stridor. No respiratory distress. He has no wheezes. He has no rales. He exhibits no tenderness.  Abdominal: Soft. Bowel sounds are normal. He exhibits no distension and no mass. There is no tenderness. There is no rebound and no guarding.  Musculoskeletal: Normal range of motion. He exhibits no edema or tenderness.  Lymphadenopathy:    He has no cervical adenopathy.  Neurological: He is oriented to person, place, and time.  Skin: Skin is warm and dry. No rash noted. He is not diaphoretic. No erythema. No pallor.    Lab Results  Component Value Date   WBC 4.3 04/28/2015   HGB 11.9* 04/28/2015   HCT 34.4* 04/28/2015   PLT 196.0 04/28/2015   GLUCOSE 118* 04/28/2015   CHOL 129 04/28/2015   TRIG 329.0* 04/28/2015   HDL 29.70* 04/28/2015   LDLDIRECT 71.0 04/28/2015   LDLCALC 45 03/26/2014   ALT 20 03/26/2014   AST 24 03/26/2014   NA 142 04/28/2015   K 3.7 04/28/2015   CL 103 04/28/2015   CREATININE 1.58* 04/28/2015   BUN 20 04/28/2015   CO2 33* 04/28/2015   TSH 2.02 04/28/2015   PSA 0.51 03/26/2014   INR 1.3* 11/03/2014   HGBA1C 6.2 04/28/2015   MICROALBUR 93.1* 11/03/2014    Dg Chest 2 View  04/29/2015   CLINICAL DATA:  Cough and fever for 2 weeks. Coronary artery disease.  EXAM: CHEST  2 VIEW  COMPARISON:  01/07/2012  FINDINGS: The heart size and mediastinal contours are within normal limits. Both lungs are clear. No evidence of pleural effusion. Prior CABG again noted.  IMPRESSION: No active cardiopulmonary disease.   Electronically Signed   By: Earle Gell  M.D.   On: 04/29/2015 07:52    Assessment & Plan:   Troy Moore was seen today for diabetes, hyperlipidemia, hypertension and cough.  Diagnoses and all orders for this visit:  DM (diabetes mellitus), secondary, uncontrolled, with peripheral vascular complications Orders: -     Basic metabolic panel; Future -     Hemoglobin A1c; Future  Kidney disease, chronic, stage III (GFR 30-59 ml/min)- his renal function has declined slightly. His BUN is up a little bit. Will continue to aim for strict blood pressure and blood sugar control. Orders: -     Basic metabolic panel; Future  B12 deficiency anemia- his B12 level is normal now.  Essential hypertension- his blood pressures well controlled, lites are stable. Orders: -  amLODipine (NORVASC) 10 MG tablet; Take one tablet by mouth one time daily -     CBC with Differential/Platelet; Future -     Vitamin B12; Future -     Folate; Future  Hyperlipidemia with target LDL less than 70- he has achieved his LDL goal but triglycerides are elevated, and he agrees to work on his lifestyle modifications for this. Orders: -     Lipid panel; Future -     TSH; Future  Cough- His exam and chest x-ray are normal, this appears to be a viral upper respiratory infection and he tells me that it is improving. Will treat with symptomatic relief. Orders: -     promethazine-codeine (PHENERGAN WITH CODEINE) 6.25-10 MG/5ML syrup; Take 5 mLs by mouth every 4 (four) hours as needed for cough. -     DG Chest 2 View; Future  Other orders -     amoxicillin (AMOXIL) 500 MG tablet; Take 4 Tablets 1 hour before procedure   I am having Mr. Pylant maintain his MULTIPLE VITAMIN PO, vitamin C, latanoprost, brimonidine, potassium chloride, fenofibrate, CRESTOR, Suvorexant, Insulin Glargine, glipiZIDE, saxagliptin HCl, metoprolol, warfarin, furosemide, metFORMIN, losartan, amLODipine, amoxicillin, and promethazine-codeine.  Meds ordered this encounter  Medications  .  losartan (COZAAR) 50 MG tablet    Sig: Take 50 mg by mouth daily.    Refill:  0  . amLODipine (NORVASC) 10 MG tablet    Sig: Take one tablet by mouth one time daily    Dispense:  90 tablet    Refill:  3  . amoxicillin (AMOXIL) 500 MG tablet    Sig: Take 4 Tablets 1 hour before procedure    Dispense:  8 tablet    Refill:  1  . promethazine-codeine (PHENERGAN WITH CODEINE) 6.25-10 MG/5ML syrup    Sig: Take 5 mLs by mouth every 4 (four) hours as needed for cough.    Dispense:  240 mL    Refill:  1     Follow-up: Return in about 3 weeks (around 05/19/2015).  Scarlette Calico, MD

## 2015-05-23 ENCOUNTER — Telehealth: Payer: Self-pay | Admitting: *Deleted

## 2015-05-23 NOTE — Telephone Encounter (Signed)
Left msg on triage stating needing to know last time pt had A1c check & result. Called [facility] last A1c was done 7/16 & results was 6.1.../lmb

## 2015-06-07 ENCOUNTER — Other Ambulatory Visit: Payer: Self-pay | Admitting: Cardiology

## 2015-06-07 NOTE — Telephone Encounter (Signed)
REFILL 

## 2015-06-09 ENCOUNTER — Other Ambulatory Visit: Payer: Self-pay

## 2015-06-09 MED ORDER — ROSUVASTATIN CALCIUM 10 MG PO TABS: 10.0000 mg | ORAL_TABLET | Freq: Every day | ORAL | Status: AC

## 2015-07-07 ENCOUNTER — Other Ambulatory Visit: Payer: Self-pay

## 2015-07-07 MED ORDER — FUROSEMIDE 40 MG PO TABS: 40.0000 mg | ORAL_TABLET | Freq: Every day | ORAL | Status: AC

## 2015-09-12 ENCOUNTER — Other Ambulatory Visit: Payer: Self-pay | Admitting: Internal Medicine

## 2015-11-21 ENCOUNTER — Other Ambulatory Visit: Payer: Self-pay | Admitting: Internal Medicine

## 2015-12-15 ENCOUNTER — Other Ambulatory Visit: Payer: Self-pay | Admitting: Internal Medicine

## 2015-12-15 NOTE — Telephone Encounter (Signed)
Patient stated this came to our office in error---he is having someone check INR and refill coumadin---patient to contact his pharm---i am refusing this refill request

## 2015-12-15 NOTE — Telephone Encounter (Signed)
Please advise, i am not showing a current INR lab value---(last INR jan/2016)--thanks

## 2016-04-03 IMAGING — CR DG CHEST 2V
2 series · 2 of 2 positions shown · non-contrast
Comparison: 01/07/2012

CLINICAL DATA: Cough and fever for 2 weeks. Coronary artery
disease.

EXAM:
CHEST  2 VIEW

[view not recorded (1 of 2)]
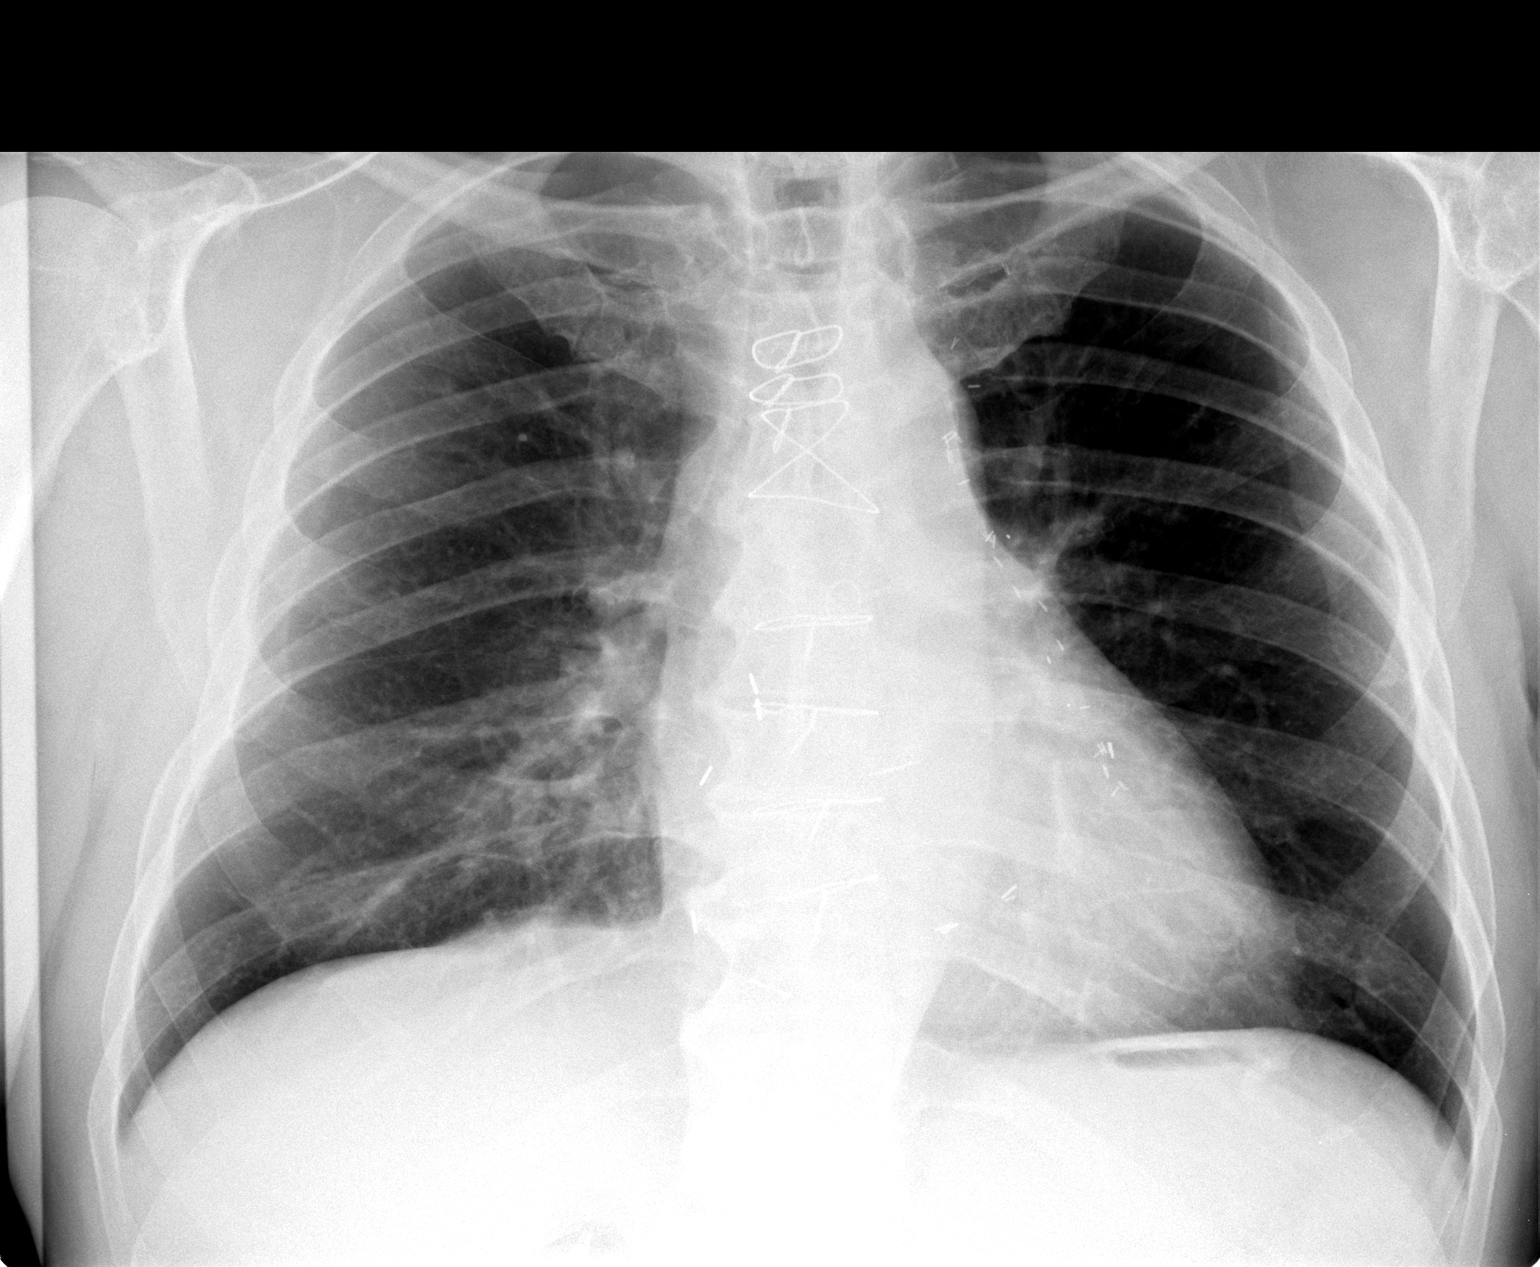

[view not recorded (2 of 2)]
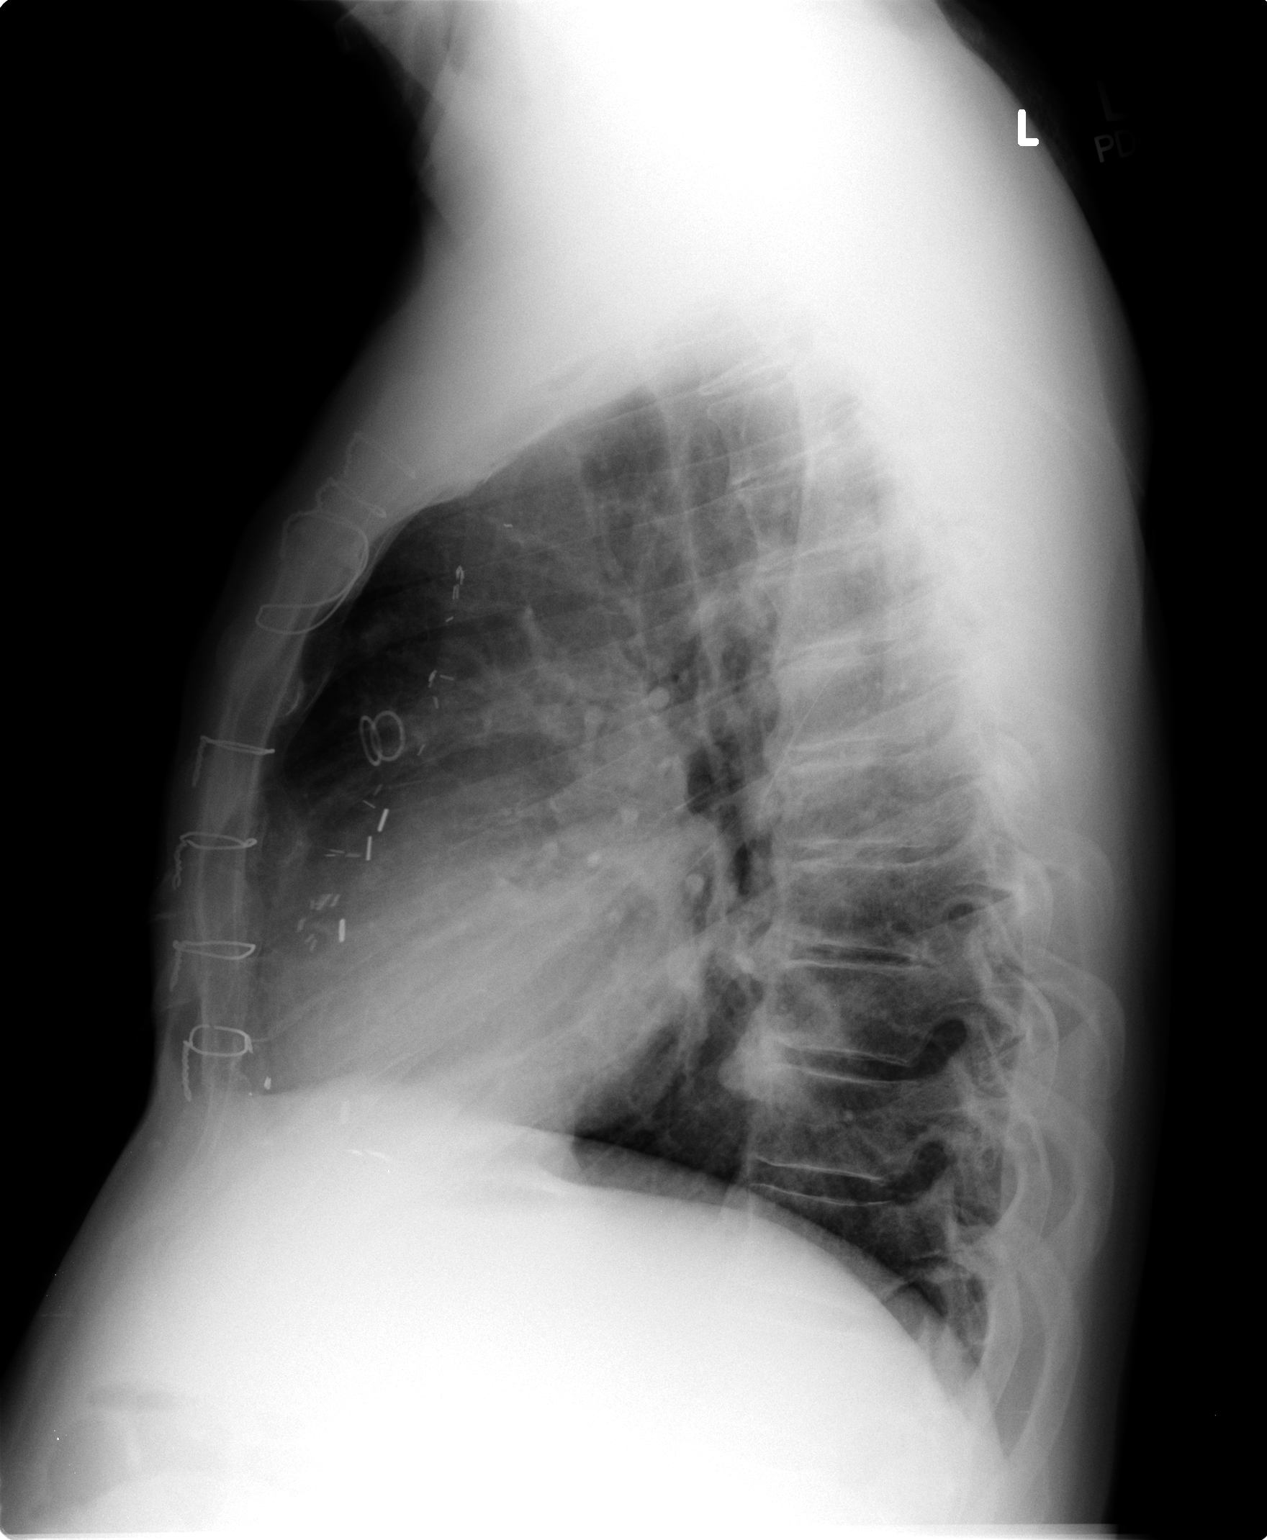

[2 of 2 positions shown; findings below may reference images not displayed]

FINDINGS: The heart size and mediastinal contours are within normal limits.
Both lungs are clear. No evidence of pleural effusion. Prior CABG
again noted.
IMPRESSION: No active cardiopulmonary disease.

## 2016-05-13 ENCOUNTER — Other Ambulatory Visit: Payer: Self-pay | Admitting: Internal Medicine

## 2016-06-29 ENCOUNTER — Other Ambulatory Visit: Payer: Self-pay | Admitting: Internal Medicine
